# Patient Record
Sex: Female | Born: 1970
Health system: Southern US, Community
[De-identification: ages and names within clinical notes are randomized; demographics above are authoritative.]

## PROBLEM LIST (undated history)

## (undated) DIAGNOSIS — M549 Dorsalgia, unspecified: Secondary | ICD-10-CM

## (undated) DIAGNOSIS — K760 Fatty (change of) liver, not elsewhere classified: Secondary | ICD-10-CM

## (undated) DIAGNOSIS — I2510 Atherosclerotic heart disease of native coronary artery without angina pectoris: Secondary | ICD-10-CM

## (undated) DIAGNOSIS — I509 Heart failure, unspecified: Secondary | ICD-10-CM

## (undated) DIAGNOSIS — Z5189 Encounter for other specified aftercare: Secondary | ICD-10-CM

## (undated) DIAGNOSIS — K219 Gastro-esophageal reflux disease without esophagitis: Secondary | ICD-10-CM

## (undated) DIAGNOSIS — I10 Essential (primary) hypertension: Secondary | ICD-10-CM

## (undated) DIAGNOSIS — M25569 Pain in unspecified knee: Secondary | ICD-10-CM

## (undated) DIAGNOSIS — I2699 Other pulmonary embolism without acute cor pulmonale: Secondary | ICD-10-CM

## (undated) DIAGNOSIS — R519 Headache, unspecified: Secondary | ICD-10-CM

## (undated) DIAGNOSIS — G8929 Other chronic pain: Secondary | ICD-10-CM

## (undated) DIAGNOSIS — R51 Headache: Secondary | ICD-10-CM

## (undated) DIAGNOSIS — J449 Chronic obstructive pulmonary disease, unspecified: Secondary | ICD-10-CM

## (undated) DIAGNOSIS — G459 Transient cerebral ischemic attack, unspecified: Secondary | ICD-10-CM

## (undated) DIAGNOSIS — D649 Anemia, unspecified: Secondary | ICD-10-CM

## (undated) HISTORY — PX: TUBAL LIGATION: SHX77

## (undated) HISTORY — PX: ENDOMETRIAL ABLATION: SHX621

---

## 2016-04-14 ENCOUNTER — Encounter (HOSPITAL_BASED_OUTPATIENT_CLINIC_OR_DEPARTMENT_OTHER): Payer: Self-pay | Admitting: Emergency Medicine

## 2016-04-14 ENCOUNTER — Emergency Department (HOSPITAL_BASED_OUTPATIENT_CLINIC_OR_DEPARTMENT_OTHER): Payer: Medicare HMO

## 2016-04-14 ENCOUNTER — Emergency Department (HOSPITAL_BASED_OUTPATIENT_CLINIC_OR_DEPARTMENT_OTHER)
Admission: EM | Admit: 2016-04-14 | Discharge: 2016-04-15 | Disposition: A | Discharge status: Home / Self Care | Payer: Medicare HMO | Attending: Emergency Medicine | Admitting: Emergency Medicine

## 2016-04-14 DIAGNOSIS — Z7951 Long term (current) use of inhaled steroids: Secondary | ICD-10-CM | POA: Diagnosis not present

## 2016-04-14 DIAGNOSIS — I110 Hypertensive heart disease with heart failure: Secondary | ICD-10-CM | POA: Diagnosis not present

## 2016-04-14 DIAGNOSIS — Z8673 Personal history of transient ischemic attack (TIA), and cerebral infarction without residual deficits: Secondary | ICD-10-CM | POA: Diagnosis not present

## 2016-04-14 DIAGNOSIS — Z79899 Other long term (current) drug therapy: Secondary | ICD-10-CM | POA: Diagnosis not present

## 2016-04-14 DIAGNOSIS — M722 Plantar fascial fibromatosis: Secondary | ICD-10-CM

## 2016-04-14 DIAGNOSIS — J449 Chronic obstructive pulmonary disease, unspecified: Secondary | ICD-10-CM | POA: Diagnosis not present

## 2016-04-14 DIAGNOSIS — I509 Heart failure, unspecified: Secondary | ICD-10-CM | POA: Insufficient documentation

## 2016-04-14 DIAGNOSIS — Z792 Long term (current) use of antibiotics: Secondary | ICD-10-CM | POA: Diagnosis not present

## 2016-04-14 DIAGNOSIS — I10 Essential (primary) hypertension: Secondary | ICD-10-CM | POA: Diagnosis not present

## 2016-04-14 DIAGNOSIS — M79672 Pain in left foot: Secondary | ICD-10-CM | POA: Diagnosis present

## 2016-04-14 HISTORY — DX: Dorsalgia, unspecified: M54.9

## 2016-04-14 HISTORY — DX: Fatty (change of) liver, not elsewhere classified: K76.0

## 2016-04-14 HISTORY — DX: Atherosclerotic heart disease of native coronary artery without angina pectoris: I25.10

## 2016-04-14 HISTORY — DX: Headache: R51

## 2016-04-14 HISTORY — DX: Chronic obstructive pulmonary disease, unspecified: J44.9

## 2016-04-14 HISTORY — DX: Anemia, unspecified: D64.9

## 2016-04-14 HISTORY — DX: Other chronic pain: G89.29

## 2016-04-14 HISTORY — DX: Heart failure, unspecified: I50.9

## 2016-04-14 HISTORY — DX: Pain in unspecified knee: M25.569

## 2016-04-14 HISTORY — DX: Transient cerebral ischemic attack, unspecified: G45.9

## 2016-04-14 HISTORY — DX: Headache, unspecified: R51.9

## 2016-04-14 HISTORY — DX: Gastro-esophageal reflux disease without esophagitis: K21.9

## 2016-04-14 HISTORY — DX: Other pulmonary embolism without acute cor pulmonale: I26.99

## 2016-04-14 HISTORY — DX: Essential (primary) hypertension: I10

## 2016-04-14 HISTORY — DX: Encounter for other specified aftercare: Z51.89

## 2016-04-14 LAB — COMPREHENSIVE METABOLIC PANEL
ALK PHOS: 69 U/L (ref 38–126)
ALT: 11 U/L — AB (ref 14–54)
AST: 14 U/L — AB (ref 15–41)
Albumin: 3.6 g/dL (ref 3.5–5.0)
Anion gap: 6 (ref 5–15)
BILIRUBIN TOTAL: 0.6 mg/dL (ref 0.3–1.2)
BUN: 18 mg/dL (ref 6–20)
CALCIUM: 8.7 mg/dL — AB (ref 8.9–10.3)
CO2: 27 mmol/L (ref 22–32)
CREATININE: 1.12 mg/dL — AB (ref 0.44–1.00)
Chloride: 102 mmol/L (ref 101–111)
GFR calc Af Amer: >60 mL/min (ref >60)
GFR, EST NON AFRICAN AMERICAN: 58 mL/min — AB (ref >60)
GLUCOSE: 104 mg/dL — AB (ref 65–99)
POTASSIUM: 3.7 mmol/L (ref 3.5–5.1)
Sodium: 135 mmol/L (ref 135–145)
TOTAL PROTEIN: 6.9 g/dL (ref 6.5–8.1)

## 2016-04-14 LAB — CBC WITH DIFFERENTIAL/PLATELET
BASOS ABS: 0 10*3/uL (ref 0.0–0.1)
BASOS PCT: 0 %
EOS ABS: 0.6 10*3/uL (ref 0.0–0.7)
EOS PCT: 9 %
HCT: 37.1 % (ref 36.0–46.0)
Hemoglobin: 12.2 g/dL (ref 12.0–15.0)
Lymphocytes Relative: 21 %
Lymphs Abs: 1.4 10*3/uL (ref 0.7–4.0)
MCH: 31 pg (ref 26.0–34.0)
MCHC: 32.9 g/dL (ref 30.0–36.0)
MCV: 94.4 fL (ref 78.0–100.0)
MONO ABS: 0.6 10*3/uL (ref 0.1–1.0)
Monocytes Relative: 8 %
Neutro Abs: 4.2 10*3/uL (ref 1.7–7.7)
Neutrophils Relative %: 62 %
PLATELETS: 210 10*3/uL (ref 150–400)
RBC: 3.93 MIL/uL (ref 3.87–5.11)
RDW: 13.8 % (ref 11.5–15.5)
WBC: 6.9 10*3/uL (ref 4.0–10.5)

## 2016-04-14 LAB — PROTIME-INR
INR: 1.88
PROTHROMBIN TIME: 21.9 s — AB (ref 11.4–15.2)

## 2016-04-14 NOTE — ED Triage Notes (Signed)
Pt having left foot pain over one month.  Heel hurts but pt is unable to visualize her foot due to size.  Pt states her legs seem more swollen.

## 2016-04-14 NOTE — ED Notes (Addendum)
C/o L heel pain, ongoing worsening, TTP, heel skin dry flaky intact, no redness bruising or other markings, BLE morbidly obese equally w/o edema, seen previously at [facility] with xrays "negative", takes daily oxycodone and that is not helping, has not seen specialist, PCP is a Dr. Luana Shu. Pinpoints pain to "just L heel", (denies: injury, foot, ankle leg pain, numbness/ tingling or weakness), "worse at night". Family member at [facility].  Other family member also a pt in the ED at this time.

## 2016-04-14 NOTE — ED Provider Notes (Signed)
[place] DEPT [facility] Provider Note   CSN: [id number] Arrival date & time: 04/14/16  1847  By signing my name below, I, Irene Pap, attest that this documentation has been prepared under the direction and in the presence of Alyse Low, [place]. Electronically Signed: Irene Pap, ED Scribe. 04/14/16. 8:12 PM.  First Provider Contact:  None    History   Chief Complaint Chief Complaint  Patient presents with  . Foot Pain   The history is provided by the patient. No language interpreter was used.  HPI Comments: Heather York is a 45 y.o. female with a hx of CHF, COPD, CAD, HTN, PE, and TIA who presents to the Emergency Department complaining of gradually worsening left heel pain onset one month ago. Pt reports associated intermittent SOB and gradually worsening left leg swelling. She has been seen for the heel pain and her x-rays were negative. She states that she is unable to bear weight without intense pain. Pt is on fluid pills. She takes daily oxycodone for her chronic pain to no relief. She denies injury, fall, wound, numbness, or weakness.   Past Medical History:  Diagnosis Date  . Anemia   . Blood transfusion without reported diagnosis   . CHF (congestive heart failure) ([place])   . Chronic back pain   . Chronic knee pain   . COPD (chronic obstructive pulmonary disease) ([place])   . Coronary artery disease   . Fatty liver   . GERD (gastroesophageal reflux disease)   . Headache   . Hypertension   . PE (pulmonary embolism)   . TIA (transient ischemic attack)     There are no active problems to display for this patient.   Past Surgical History:  Procedure Laterality Date  . CESAREAN SECTION    . ENDOMETRIAL ABLATION    . TUBAL LIGATION      OB History    No data available       Home Medications    Prior to Admission medications   Medication Sig Start Date End Date Taking? Authorizing Provider  albuterol (PROVENTIL HFA;VENTOLIN HFA) 108 (90 Base) MCG/ACT  inhaler Inhale into the lungs every 6 (six) hours as needed for wheezing or shortness of breath.   Yes Historical Provider, MD  amiodarone (PACERONE) 200 MG tablet Take 200 mg by mouth daily.   Yes Historical Provider, MD  atenolol (TENORMIN) 25 MG tablet Take 12.5 mg by mouth daily.   Yes Historical Provider, MD  cyclobenzaprine (FLEXERIL) 10 MG tablet Take 10 mg by mouth 3 (three) times daily as needed for muscle spasms.   Yes Historical Provider, MD  doxycycline (VIBRA-TABS) 100 MG tablet Take 100 mg by mouth 2 (two) times daily. 03/29/16  Yes Historical Provider, MD  esomeprazole (NEXIUM) 40 MG capsule Take 40 mg by mouth daily at 12 noon.   Yes Historical Provider, MD  ferrous sulfate 325 (65 FE) MG EC tablet Take 325 mg by mouth 3 (three) times daily with meals.   Yes Historical Provider, MD  furosemide (LASIX) 40 MG tablet Take 40 mg by mouth.   Yes Historical Provider, MD  oxycodone (OXY-IR) 5 MG capsule Take 5 mg by mouth every 4 (four) hours as needed.   Yes Historical Provider, MD  oxyCODONE-acetaminophen (ROXICET) 5-325 MG/5ML solution Take by mouth every 4 (four) hours as needed for severe pain.   Yes Historical Provider, MD  promethazine (PHENERGAN) 25 MG tablet Take 25 mg by mouth every 6 (six) hours as needed for nausea or  vomiting.   Yes Historical Provider, MD  spironolactone (ALDACTONE) 50 MG tablet Take 50 mg by mouth daily.   Yes Historical Provider, MD  warfarin (COUMADIN) 5 MG tablet Take 5 mg by mouth daily.   Yes Historical Provider, MD    Family History No family history on file.  Social History Social History  Substance Use Topics  . Smoking status: Never Smoker  . Smokeless tobacco: Never Used  . Alcohol use Not on file     Allergies   Clindamycin/lincomycin; Rocephin [ceftriaxone sodium in dextrose]; and Vancomycin   Review of Systems Review of Systems  Musculoskeletal: Positive for arthralgias.  Skin: Negative for wound.  Neurological: Negative for  weakness and numbness.  All other systems reviewed and are negative.    Physical Exam Updated Vital Signs BP 112/68 (BP Location: Right Arm)   Pulse 76   Temp 98.4 F (36.9 C) (Oral)   Resp 22   Ht 5\' 6"  (1.676 m)   Wt (!) 371 lb (168.3 kg)   LMP 03/27/2016 (Approximate)   SpO2 98%   BMI 59.88 kg/m   Physical Exam  Constitutional: She is oriented to person, place, and time.  Morbidly obese  HENT:  Head: Normocephalic and atraumatic.  Mouth/Throat: Oropharynx is clear and moist.  Eyes: Conjunctivae and EOM are normal. Pupils are equal, round, and reactive to light.  Neck: Normal range of motion. Neck supple.  Cardiovascular: Normal rate and regular rhythm.   Pulmonary/Chest: Effort normal.  Abdominal: Soft. There is no tenderness.  Musculoskeletal: Normal range of motion. She exhibits edema.  Significant bilateral lower extremity edema  Neurological: She is alert and oriented to person, place, and time.  Skin: Skin is warm and dry.  Significant flaking to the skin on the plantar surface of the left foot; no drainage, erythema, or signs of infection  Psychiatric: She has a normal mood and affect. Her behavior is normal.  Nursing note and vitals reviewed.    ED Treatments / Results  DIAGNOSTIC STUDIES: Oxygen Saturation is 98% on RA, normal by my interpretation.    COORDINATION OF CARE: 8:09 PM-Discussed treatment plan which includes labs and x-ray with pt at bedside and pt agreed to plan.    Labs (all labs ordered are listed, but only abnormal results are displayed) Labs Reviewed  COMPREHENSIVE METABOLIC PANEL - Abnormal; Notable for the following:       Result Value   Glucose, Bld 104 (*)    Creatinine, Ser 1.12 (*)    Calcium 8.7 (*)    AST 14 (*)    ALT 11 (*)    GFR calc non Af Amer 58 (*)    All other components within normal limits  PROTIME-INR - Abnormal; Notable for the following:    Prothrombin Time 21.9 (*)    All other components within normal  limits  CBC WITH DIFFERENTIAL/PLATELET    EKG  EKG Interpretation None       Radiology Dg Foot Complete Left  Result Date: 04/14/2016 CLINICAL DATA:  Chronic left heel pain for 1 month. No known injury. Limited range of motion. EXAM: LEFT FOOT - COMPLETE 3+ VIEW COMPARISON:  None. FINDINGS: Osseous alignment is normal. Bone mineralization appears grossly normal throughout. No fracture line or displaced fracture fragment identified. No evidence of degenerative osteoarthritis. No erosions or other signs of an inflammatory arthritis. Adjacent soft tissues are unremarkable. Enthesophyte noted at the plantar margin of the posterior calcaneus, indicating a chronic tendinopathy. IMPRESSION: 1. Enthesophyte at  the plantar margin of the posterior calcaneus, indicating chronic tendinopathy, a possible source for the chronic heel pain. 2. No acute findings. Electronically Signed   By: Franki Cabot M.D.   On: 04/14/2016 20:44    Procedures Procedures (including critical care time)  Medications Ordered in ED Medications - No data to display   Initial Impression / Assessment and Plan / ED Course  I have reviewed the triage vital signs and the nursing notes.  Pertinent labs & imaging results that were available during my care of the patient were reviewed by me and considered in my medical decision making (see chart for details).  Clinical Course      Final Clinical Impressions(s) / ED Diagnoses   Final diagnoses:  Plantar fasciitis of left foot   Pt is on oxycodone.  Pt gets regular prescriptions every month.    New Prescriptions New Prescriptions   No medications on file     Fransico Meadow, PA-C 04/15/16 [place] Liu, MD 04/15/16 1054

## 2016-04-14 NOTE — ED Notes (Signed)
Pt to xray via stretcher, no changes.

## 2017-01-16 ENCOUNTER — Emergency Department (HOSPITAL_BASED_OUTPATIENT_CLINIC_OR_DEPARTMENT_OTHER)
Admission: EM | Admit: 2017-01-16 | Discharge: 2017-01-16 | Disposition: A | Discharge status: Home / Self Care | Payer: Medicare HMO | Attending: Emergency Medicine | Admitting: Emergency Medicine

## 2017-01-16 ENCOUNTER — Emergency Department (HOSPITAL_BASED_OUTPATIENT_CLINIC_OR_DEPARTMENT_OTHER): Payer: Medicare HMO

## 2017-01-16 ENCOUNTER — Encounter (HOSPITAL_BASED_OUTPATIENT_CLINIC_OR_DEPARTMENT_OTHER): Payer: Self-pay | Admitting: Emergency Medicine

## 2017-01-16 DIAGNOSIS — I110 Hypertensive heart disease with heart failure: Secondary | ICD-10-CM | POA: Insufficient documentation

## 2017-01-16 DIAGNOSIS — I509 Heart failure, unspecified: Secondary | ICD-10-CM | POA: Diagnosis not present

## 2017-01-16 DIAGNOSIS — Z7901 Long term (current) use of anticoagulants: Secondary | ICD-10-CM | POA: Insufficient documentation

## 2017-01-16 DIAGNOSIS — Z79899 Other long term (current) drug therapy: Secondary | ICD-10-CM | POA: Diagnosis not present

## 2017-01-16 DIAGNOSIS — Z87891 Personal history of nicotine dependence: Secondary | ICD-10-CM | POA: Diagnosis not present

## 2017-01-16 DIAGNOSIS — R1030 Lower abdominal pain, unspecified: Secondary | ICD-10-CM | POA: Diagnosis not present

## 2017-01-16 DIAGNOSIS — J449 Chronic obstructive pulmonary disease, unspecified: Secondary | ICD-10-CM | POA: Insufficient documentation

## 2017-01-16 DIAGNOSIS — I2510 Atherosclerotic heart disease of native coronary artery without angina pectoris: Secondary | ICD-10-CM | POA: Insufficient documentation

## 2017-01-16 DIAGNOSIS — R102 Pelvic and perineal pain: Secondary | ICD-10-CM | POA: Diagnosis present

## 2017-01-16 LAB — CBC WITH DIFFERENTIAL/PLATELET
BASOS ABS: 0 10*3/uL (ref 0.0–0.1)
Basophils Relative: 0 %
EOS PCT: 5 %
Eosinophils Absolute: 0.3 10*3/uL (ref 0.0–0.7)
HEMATOCRIT: 38.2 % (ref 36.0–46.0)
Hemoglobin: 12.7 g/dL (ref 12.0–15.0)
LYMPHS ABS: 1 10*3/uL (ref 0.7–4.0)
LYMPHS PCT: 18 %
MCH: 30 pg (ref 26.0–34.0)
MCHC: 33.2 g/dL (ref 30.0–36.0)
MCV: 90.1 fL (ref 78.0–100.0)
MONOS PCT: 8 %
Monocytes Absolute: 0.5 10*3/uL (ref 0.1–1.0)
NEUTROS ABS: 3.9 10*3/uL (ref 1.7–7.7)
Neutrophils Relative %: 69 %
Platelets: 218 10*3/uL (ref 150–400)
RBC: 4.24 MIL/uL (ref 3.87–5.11)
RDW: 13.6 % (ref 11.5–15.5)
WBC: 5.7 10*3/uL (ref 4.0–10.5)

## 2017-01-16 LAB — COMPREHENSIVE METABOLIC PANEL
ALT: 22 U/L (ref 14–54)
AST: 19 U/L (ref 15–41)
Albumin: 3.4 g/dL — ABNORMAL LOW (ref 3.5–5.0)
Alkaline Phosphatase: 95 U/L (ref 38–126)
Anion gap: 5 (ref 5–15)
BILIRUBIN TOTAL: 0.5 mg/dL (ref 0.3–1.2)
BUN: 9 mg/dL (ref 6–20)
CO2: 28 mmol/L (ref 22–32)
Calcium: 8.4 mg/dL — ABNORMAL LOW (ref 8.9–10.3)
Chloride: 105 mmol/L (ref 101–111)
Creatinine, Ser: 1.05 mg/dL — ABNORMAL HIGH (ref 0.44–1.00)
Glucose, Bld: 109 mg/dL — ABNORMAL HIGH (ref 65–99)
POTASSIUM: 3.5 mmol/L (ref 3.5–5.1)
Sodium: 138 mmol/L (ref 135–145)
TOTAL PROTEIN: 6.6 g/dL (ref 6.5–8.1)

## 2017-01-16 LAB — LIPASE, BLOOD: LIPASE: 23 U/L (ref 11–51)

## 2017-01-16 LAB — URINALYSIS, ROUTINE W REFLEX MICROSCOPIC
BILIRUBIN URINE: NEGATIVE
Glucose, UA: NEGATIVE mg/dL
HGB URINE DIPSTICK: NEGATIVE
Ketones, ur: NEGATIVE mg/dL
NITRITE: NEGATIVE
PROTEIN: NEGATIVE mg/dL
SPECIFIC GRAVITY, URINE: 1.007 (ref 1.005–1.030)
pH: 5.5 (ref 5.0–8.0)

## 2017-01-16 LAB — PREGNANCY, URINE: PREG TEST UR: NEGATIVE

## 2017-01-16 LAB — URINALYSIS, MICROSCOPIC (REFLEX)

## 2017-01-16 LAB — PROTIME-INR
INR: 1.79
PROTHROMBIN TIME: 21 s — AB (ref 11.4–15.2)

## 2017-01-16 MED ORDER — KETOROLAC TROMETHAMINE 30 MG/ML IJ SOLN
30.0000 mg | Freq: Once | INTRAMUSCULAR | Status: AC
  Administered 2017-01-16: 30 mg via INTRAVENOUS
  Filled 2017-01-16: qty 1

## 2017-01-16 MED ORDER — SODIUM CHLORIDE 0.9 % IV BOLUS (SEPSIS)
1000.0000 mL | Freq: Once | INTRAVENOUS | Status: AC
  Administered 2017-01-16: 1000 mL via INTRAVENOUS

## 2017-01-16 MED ORDER — DICYCLOMINE HCL 20 MG PO TABS: 20.0000 mg | ORAL_TABLET | Freq: Two times a day (BID) | ORAL | 0 refills | Status: AC

## 2017-01-16 MED ORDER — IOPAMIDOL (ISOVUE-300) INJECTION 61%
100.0000 mL | Freq: Once | INTRAVENOUS | Status: AC | PRN
  Administered 2017-01-16: 100 mL via INTRAVENOUS

## 2017-01-16 MED ORDER — MORPHINE SULFATE (PF) 4 MG/ML IV SOLN: 4.0000 mg | Freq: Once | INTRAVENOUS | Status: DC

## 2017-01-16 MED FILL — DICYCLOMINE 20 MG TABLET: 20 | 10 days supply | Qty: 20 | Fill #0

## 2017-01-16 NOTE — ED Triage Notes (Signed)
Patient states that she has some nausea yesterday and started to have some abdominal pain and points to her pelvic region. Patient reports that she "maybe Im pregnant" - patient states that she she has had pain to her stomach off and on, and did not have a period last month. Patient reports that she feels like the pain "feels like something is moving around in my stomach"

## 2017-01-16 NOTE — ED Provider Notes (Signed)
Blood pressure 129/79, pulse 100, temperature 98.3 F (36.8 C), temperature source Oral, resp. rate 18, height 5\' 6"  (1.676 m), weight (!) 388 lb (176 kg), SpO2 98 %.  Assuming care from Dr. Darl Householder.  In short, Heather York is a 45 y.o. female with a chief complaint of Pelvic Pain .  Refer to the original H&P for additional details.  The current plan of care is to follow labs and CT with reassessment.  04:10 PM CT scan with no acute findings to explain the patient's abdominal pain. Labs reviewed and unremarkable. I reevaluated the patient is feeling well. Discussed primary care physician follow-up and medication at home for symptom relief. Patient is comfortable with the plan at discharge.  At this time, I do not feel there is any life-threatening condition present. I have reviewed and discussed all results (EKG, imaging, lab, urine as appropriate), exam findings with patient. I have reviewed nursing notes and appropriate previous records.  I feel the patient is safe to be discharged home without further emergent workup. Discussed usual and customary return precautions. Patient and family (if present) verbalize understanding and are comfortable with this plan.  Patient will follow-up with their primary care provider. If they do not have a primary care provider, information for follow-up has been provided to them. All questions have been answered.  Nanda Quinton, MD    Margette Fast, MD 01/16/17 [phone]

## 2017-01-16 NOTE — ED Notes (Signed)
Dr. Darl Householder in to do pelvic exam with RN at bedside unable to get swabs due to pt. Not cooperative with exam.  Bi Manual done on pt.

## 2017-01-16 NOTE — Discharge Instructions (Signed)

## 2017-01-16 NOTE — ED Notes (Signed)
Pt. Is in CT scan at present time

## 2017-01-16 NOTE — ED Provider Notes (Signed)
[place] DEPT [facility] Provider Note   CSN: [id number] Arrival date & time: 01/16/17  1350     History   Chief Complaint Chief Complaint  Patient presents with  . Pelvic Pain    HPI Heather York is a 46 y.o. female hx of COPD, TIA, PE on Coumadin here presenting with lower abdominal pain, pelvic pain. Patient states that she's been having diffuse lower abdominal pain since yesterday. It was left-sided then went to her right side. States that she felt something moving and was concerned that she may be pregnant. She has history of irregular periods but did not have her menstrual period last month. Patient states that she was nauseated but denies any vomiting. Denies any constipation or diarrhea.   The history is provided by the patient.    Past Medical History:  Diagnosis Date  . Anemia   . Blood transfusion without reported diagnosis   . CHF (congestive heart failure) ([place])   . Chronic back pain   . Chronic knee pain   . COPD (chronic obstructive pulmonary disease) ([place])   . Coronary artery disease   . Fatty liver   . GERD (gastroesophageal reflux disease)   . Headache   . Hypertension   . PE (pulmonary embolism)   . TIA (transient ischemic attack)     There are no active problems to display for this patient.   Past Surgical History:  Procedure Laterality Date  . CESAREAN SECTION    . ENDOMETRIAL ABLATION    . TUBAL LIGATION      OB History    No data available       Home Medications    Prior to Admission medications   Medication Sig Start Date End Date Taking? Authorizing Provider  albuterol (PROVENTIL HFA;VENTOLIN HFA) 108 (90 Base) MCG/ACT inhaler Inhale into the lungs every 6 (six) hours as needed for wheezing or shortness of breath.    [provider]  amiodarone (PACERONE) 200 MG tablet Take 200 mg by mouth daily.    [provider]  atenolol (TENORMIN) 25 MG tablet Take 12.5 mg by mouth daily.    [provider]    cyclobenzaprine (FLEXERIL) 10 MG tablet Take 10 mg by mouth 3 (three) times daily as needed for muscle spasms.    [provider]  doxycycline (VIBRA-TABS) 100 MG tablet Take 100 mg by mouth 2 (two) times daily. 03/29/16   [provider]  esomeprazole (NEXIUM) 40 MG capsule Take 40 mg by mouth daily at 12 noon.    [provider]  ferrous sulfate 325 (65 FE) MG EC tablet Take 325 mg by mouth 3 (three) times daily with meals.    [provider]  furosemide (LASIX) 40 MG tablet Take 40 mg by mouth.    [provider]  oxycodone (OXY-IR) 5 MG capsule Take 5 mg by mouth every 4 (four) hours as needed.    [provider]  oxyCODONE-acetaminophen (ROXICET) 5-325 MG/5ML solution Take by mouth every 4 (four) hours as needed for severe pain.    [provider]  promethazine (PHENERGAN) 25 MG tablet Take 25 mg by mouth every 6 (six) hours as needed for nausea or vomiting.    [provider]  spironolactone (ALDACTONE) 50 MG tablet Take 50 mg by mouth daily.    [provider]  warfarin (COUMADIN) 5 MG tablet Take 5 mg by mouth daily.    [provider]    Family History History reviewed. No  pertinent family history.  Social History Social History  Substance Use Topics  . Smoking status: Former Research scientist (life sciences)  . Smokeless tobacco: Never Used  . Alcohol use No     Allergies   Clindamycin/lincomycin; Rocephin [ceftriaxone sodium in dextrose]; and Vancomycin   Review of Systems Review of Systems  Genitourinary: Positive for pelvic pain.  All other systems reviewed and are negative.    Physical Exam Updated Vital Signs BP 129/79 (BP Location: Right Arm)   Pulse 100   Temp 98.3 F (36.8 C) (Oral)   Resp 18   Ht 5\' 6"  (1.676 m)   Wt (!) 388 lb (176 kg)   LMP  (LMP Unknown)   SpO2 98%   BMI 62.62 kg/m   Physical Exam  Constitutional: She is oriented to person, place, and time.  Uncomfortable, obese    HENT:  Head: Normocephalic.  Mouth/Throat: Oropharynx is clear and moist.  Eyes: EOM are normal. Pupils are equal, round, and reactive to light.  Neck: Normal range of motion. Neck supple.  Cardiovascular: Normal rate, regular rhythm and normal heart sounds.   Pulmonary/Chest: Effort normal and breath sounds normal. No respiratory distress. She has no wheezes.  Abdominal: Soft. Bowel sounds are normal.  Obese, mild diffuse lower abdominal tenderness   Genitourinary:  Genitourinary Comments: Difficult exam, no obvious yeast. Difficulty reaching the vagina due to body habitus. Has severe pain with exam. Unable to get swabs but bimanual exam showed no CMT, no obvious uterine or adnexal tenderness   Neurological: She is alert and oriented to person, place, and time.  Skin: Skin is warm.  Psychiatric: She has a normal mood and affect.  Nursing note and vitals reviewed.    ED Treatments / Results  Labs (all labs ordered are listed, but only abnormal results are displayed) Labs Reviewed  URINALYSIS, ROUTINE W REFLEX MICROSCOPIC - Abnormal; Notable for the following:       Result Value   APPearance CLOUDY (*)    Leukocytes, UA TRACE (*)    All other components within normal limits  COMPREHENSIVE METABOLIC PANEL - Abnormal; Notable for the following:    Glucose, Bld 109 (*)    Creatinine, Ser 1.05 (*)    Calcium 8.4 (*)    Albumin 3.4 (*)    All other components within normal limits  URINALYSIS, MICROSCOPIC (REFLEX) - Abnormal; Notable for the following:    Bacteria, UA FEW (*)    Squamous Epithelial / LPF 6-30 (*)    All other components within normal limits  PREGNANCY, URINE  CBC WITH DIFFERENTIAL/PLATELET  LIPASE, BLOOD  PROTIME-INR  GC/CHLAMYDIA PROBE AMP ([place]) NOT AT [facility]    EKG  EKG Interpretation None       Radiology No results found.  Procedures Procedures (including critical care time)  Medications Ordered in ED Medications  ketorolac (TORADOL) 30  MG/ML injection 30 mg (not administered)  sodium chloride 0.9 % bolus 1,000 mL (1,000 mLs Intravenous New Bag/Given 01/16/17 1451)  iopamidol (ISOVUE-300) 61 % injection 100 mL (100 mLs Intravenous Contrast Given 01/16/17 1531)     Initial Impression / Assessment and Plan / ED Course  I have reviewed the triage vital signs and the nursing notes.  Pertinent labs & imaging results that were available during my care of the patient were reviewed by me and considered in my medical decision making (see chart for details).     Heather York is a 46 y.o. female here with lower abdominal pain since yesterday.  She missed her period a month ago but has irregular periods at baseline but UCG neg. Difficult performing pelvic exam due to body habitus so unable to get swab. Will check labs, UA, CT ab/pel.   3:34 PM Labs unremarkable. UA unremarkable. CT ab/pel pending. Signed out to Dr. Laverta Baltimore. Anticipate dc home if CT negative.   Final Clinical Impressions(s) / ED Diagnoses   Final diagnoses:  None    New Prescriptions New Prescriptions   No medications on file     Drenda Freeze, MD 01/16/17 1534

## 2017-01-18 LAB — URINE CULTURE

## 2017-07-14 ENCOUNTER — Emergency Department (HOSPITAL_BASED_OUTPATIENT_CLINIC_OR_DEPARTMENT_OTHER): Payer: Medicare HMO

## 2017-07-14 ENCOUNTER — Encounter (HOSPITAL_BASED_OUTPATIENT_CLINIC_OR_DEPARTMENT_OTHER): Payer: Self-pay | Admitting: *Deleted

## 2017-07-14 ENCOUNTER — Emergency Department (HOSPITAL_BASED_OUTPATIENT_CLINIC_OR_DEPARTMENT_OTHER)
Admission: EM | Admit: 2017-07-14 | Discharge: 2017-07-14 | Disposition: A | Discharge status: Home / Self Care | Payer: Medicare HMO | Attending: Physician Assistant | Admitting: Physician Assistant

## 2017-07-14 DIAGNOSIS — Z8673 Personal history of transient ischemic attack (TIA), and cerebral infarction without residual deficits: Secondary | ICD-10-CM | POA: Diagnosis not present

## 2017-07-14 DIAGNOSIS — G8929 Other chronic pain: Secondary | ICD-10-CM | POA: Insufficient documentation

## 2017-07-14 DIAGNOSIS — B9689 Other specified bacterial agents as the cause of diseases classified elsewhere: Secondary | ICD-10-CM | POA: Diagnosis not present

## 2017-07-14 DIAGNOSIS — J069 Acute upper respiratory infection, unspecified: Secondary | ICD-10-CM | POA: Diagnosis not present

## 2017-07-14 DIAGNOSIS — Z79899 Other long term (current) drug therapy: Secondary | ICD-10-CM | POA: Diagnosis not present

## 2017-07-14 DIAGNOSIS — D649 Anemia, unspecified: Secondary | ICD-10-CM | POA: Insufficient documentation

## 2017-07-14 DIAGNOSIS — I110 Hypertensive heart disease with heart failure: Secondary | ICD-10-CM | POA: Diagnosis not present

## 2017-07-14 DIAGNOSIS — I2510 Atherosclerotic heart disease of native coronary artery without angina pectoris: Secondary | ICD-10-CM | POA: Diagnosis not present

## 2017-07-14 DIAGNOSIS — I509 Heart failure, unspecified: Secondary | ICD-10-CM | POA: Insufficient documentation

## 2017-07-14 DIAGNOSIS — Z87891 Personal history of nicotine dependence: Secondary | ICD-10-CM | POA: Insufficient documentation

## 2017-07-14 DIAGNOSIS — J449 Chronic obstructive pulmonary disease, unspecified: Secondary | ICD-10-CM | POA: Insufficient documentation

## 2017-07-14 DIAGNOSIS — J029 Acute pharyngitis, unspecified: Secondary | ICD-10-CM | POA: Diagnosis present

## 2017-07-14 DIAGNOSIS — B9789 Other viral agents as the cause of diseases classified elsewhere: Secondary | ICD-10-CM

## 2017-07-14 LAB — RAPID STREP SCREEN (MED CTR MEBANE ONLY): STREPTOCOCCUS, GROUP A SCREEN (DIRECT): NEGATIVE

## 2017-07-14 MED ORDER — IPRATROPIUM-ALBUTEROL 0.5-2.5 (3) MG/3ML IN SOLN
3.0000 mL | Freq: Once | RESPIRATORY_TRACT | Status: AC
  Administered 2017-07-14: 3 mL via RESPIRATORY_TRACT
  Filled 2017-07-14: qty 3

## 2017-07-14 MED ORDER — PREDNISONE 20 MG PO TABS: ORAL_TABLET | ORAL | 0 refills | Status: AC

## 2017-07-14 MED ORDER — GI COCKTAIL ~~LOC~~
30.0000 mL | Freq: Once | ORAL | Status: AC
Start: 2017-07-14 — End: 2017-07-14
  Administered 2017-07-14: 30 mL via ORAL
  Filled 2017-07-14: qty 30

## 2017-07-14 MED ORDER — ALBUTEROL SULFATE HFA 108 (90 BASE) MCG/ACT IN AERS
2.0000 | INHALATION_SPRAY | Freq: Once | RESPIRATORY_TRACT | Status: AC
  Administered 2017-07-14: 2 via RESPIRATORY_TRACT
  Filled 2017-07-14: qty 6.7

## 2017-07-14 MED ORDER — PREDNISONE 50 MG PO TABS
60.0000 mg | ORAL_TABLET | Freq: Once | ORAL | Status: AC
  Administered 2017-07-14: 60 mg via ORAL
  Filled 2017-07-14: qty 1

## 2017-07-14 MED ORDER — AEROCHAMBER PLUS FLO-VU MEDIUM MISC
1.0000 | Freq: Once | Status: AC
  Administered 2017-07-14: 1
  Filled 2017-07-14: qty 1

## 2017-07-14 NOTE — ED Notes (Signed)
ED Provider at bedside. 

## 2017-07-14 NOTE — ED Notes (Signed)
Patient transported to X-ray 

## 2017-07-14 NOTE — ED Provider Notes (Signed)
[facility] EMERGENCY DEPARTMENT Provider Note   CSN: [id number] Arrival date & time: 07/14/17  1139     History   Chief Complaint Chief Complaint  Patient presents with  . Sore Throat    HPI Heather York is a 46 y.o. female.  HPI   Patient is a 46 year old female with history of COPD, CHF, obesity, Chronic pain.  She is presenting today with sore throat.  Patient reports that for the last week and a half she has had congestion, cough and wheezing.  Patient was seen at a different urgent care and told that she had fluid on her lungs.  She doubled her Lasix and has been feeling better.  However she still has sore throat.  She says she has pain on swallowing.  Past Medical History:  Diagnosis Date  . Anemia   . Blood transfusion without reported diagnosis    age 86 yrs  . CHF (congestive heart failure) ([place])   . Chronic back pain   . Chronic knee pain   . COPD (chronic obstructive pulmonary disease) ([place])   . Coronary artery disease   . Fatty liver   . GERD (gastroesophageal reflux disease)   . Headache   . Hypertension   . PE (pulmonary embolism)   . TIA (transient ischemic attack)     There are no active problems to display for this patient.   Past Surgical History:  Procedure Laterality Date  . CESAREAN SECTION    . ENDOMETRIAL ABLATION    . TUBAL LIGATION      OB History    No data available       Home Medications    Prior to Admission medications   Medication Sig Start Date End Date Taking? Authorizing Provider  albuterol (PROVENTIL HFA;VENTOLIN HFA) 108 (90 Base) MCG/ACT inhaler Inhale into the lungs every 6 (six) hours as needed for wheezing or shortness of breath.   Yes [provider]  amiodarone (PACERONE) 200 MG tablet Take 200 mg by mouth daily.   Yes [provider]  atenolol (TENORMIN) 25 MG tablet Take 12.5 mg by mouth daily.   Yes [provider]  cyclobenzaprine (FLEXERIL) 10 MG tablet Take 10 mg by  mouth 3 (three) times daily as needed for muscle spasms.   Yes [provider]  furosemide (LASIX) 40 MG tablet Take 40 mg by mouth.   Yes [provider]  OMEPRAZOLE PO Take by mouth.   Yes [provider]  oxycodone (OXY-IR) 5 MG capsule Take 5 mg by mouth every 4 (four) hours as needed.   Yes [provider]  oxyCODONE-acetaminophen (ROXICET) 5-325 MG/5ML solution Take by mouth every 4 (four) hours as needed for severe pain.   Yes [provider]  spironolactone (ALDACTONE) 50 MG tablet Take 50 mg by mouth daily.   Yes [provider]  warfarin (COUMADIN) 5 MG tablet Take 5 mg by mouth daily.   Yes [provider]  dicyclomine (BENTYL) 20 MG tablet Take 1 tablet (20 mg total) by mouth 2 (two) times daily. 01/16/17   Long, Wonda Olds, MD  doxycycline (VIBRA-TABS) 100 MG tablet Take 100 mg by mouth 2 (two) times daily. 03/29/16   [provider]  esomeprazole (NEXIUM) 40 MG capsule Take 40 mg by mouth daily at 12 noon.    [provider]  ferrous sulfate 325 (65 FE) MG EC tablet Take 325 mg by mouth 3 (three) times daily with meals.  [provider]  promethazine (PHENERGAN) 25 MG tablet Take 25 mg by mouth every 6 (six) hours as needed for nausea or vomiting.    [provider]    Family History No family history on file.  Social History Social History  Substance Use Topics  . Smoking status: Former Research scientist (life sciences)  . Smokeless tobacco: Never Used  . Alcohol use No     Allergies   Clindamycin/lincomycin; Rocephin [ceftriaxone sodium in dextrose]; and Vancomycin   Review of Systems Review of Systems  Constitutional: Negative for activity change and fever.  HENT: Positive for congestion and sore throat.   Respiratory: Positive for cough and wheezing. Negative for chest tightness and shortness of breath.   Cardiovascular: Negative for chest pain.  Gastrointestinal: Negative for abdominal pain.    All other systems reviewed and are negative.    Physical Exam Updated Vital Signs BP 116/85 (BP Location: Left Arm)   Pulse 84   Temp 98.3 F (36.8 C) (Oral)   Resp 20   Ht 5\' 6"  (1.676 m)   Wt (!) 173.9 kg (383 lb 6 oz)   SpO2 99%   BMI 61.88 kg/m   Physical Exam  Constitutional: She is oriented to person, place, and time. She appears well-developed and well-nourished.  HENT:  Head: Normocephalic and atraumatic.  Right Ear: External ear normal.  Left Ear: External ear normal.  Mild erythema to posterior pharynx.  No exudate.  Eyes: Right eye exhibits no discharge.  Cardiovascular: Normal rate, regular rhythm and normal heart sounds.   No murmur heard. Pulmonary/Chest: Effort normal. She has wheezes. She has no rales.  Mild scattered wheezing.  Abdominal: Soft. She exhibits no distension. There is no tenderness.  Neurological: She is oriented to person, place, and time.  Skin: Skin is warm and dry. She is not diaphoretic.  Psychiatric: She has a normal mood and affect.  Nursing note and vitals reviewed.    ED Treatments / Results  Labs (all labs ordered are listed, but only abnormal results are displayed) Labs Reviewed  RAPID STREP SCREEN (NOT AT [facility])    EKG  EKG Interpretation None       Radiology No results found.  Procedures Procedures (including critical care time)  Medications Ordered in ED Medications  predniSONE (DELTASONE) tablet 60 mg (not administered)  ipratropium-albuterol (DUONEB) 0.5-2.5 (3) MG/3ML nebulizer solution 3 mL (not administered)  gi cocktail (Maalox,Lidocaine,Donnatal) (not administered)     Initial Impression / Assessment and Plan / ED Course  I have reviewed the triage vital signs and the nursing notes.  Pertinent labs & imaging results that were available during my care of the patient were reviewed by me and considered in my medical decision making (see chart for details).     Patient is a 46 year old female with  history of COPD, CHF, obesity, Chronic pain.  She is presenting today with sore throat.  Patient reports that for the last week and a half she has had congestion, cough and wheezing.  Patient was seen at a different urgent care and told that she had fluid on her lungs.  She doubled her Lasix and has been feeling better.  However she still has sore throat.  She says she has pain on swallowing.  12:14 PM Will get chest x-ray.  I think the wheezing is likely due to COPD exacerbation.  We will start her on steroids.  This was also help with inflammation in her throat.  Her constilation  of symptoms is  suggestive of a viral illness that has caused a COPD exacerbation.  1:35 PM Patient sounds much better after 1 DuoNeb.  We will give her albuterol inhaler for home.  We will give her steroid taper.  Normal vital signs and appears very well.  We will have her follow-up with primary care physician.  Final Clinical Impressions(s) / ED Diagnoses   Final diagnoses:  None    New Prescriptions New Prescriptions   No medications on file     Macarthur Critchley, MD 07/14/17 1336

## 2017-07-14 NOTE — Discharge Instructions (Signed)
There is will help decrease inflammation in her lungs and your throat.  Please follow-up with your primary care physician.

## 2017-07-14 NOTE — ED Notes (Signed)
Pt discharged to home with family. NAD.  

## 2017-07-14 NOTE — ED Triage Notes (Signed)
Sore Throat for one week.  No fever.

## 2017-07-16 LAB — CULTURE, GROUP A STREP (THRC)

## 2017-09-12 ENCOUNTER — Encounter (HOSPITAL_BASED_OUTPATIENT_CLINIC_OR_DEPARTMENT_OTHER): Payer: Self-pay | Admitting: Emergency Medicine

## 2017-09-12 ENCOUNTER — Emergency Department (HOSPITAL_BASED_OUTPATIENT_CLINIC_OR_DEPARTMENT_OTHER): Payer: Medicare HMO

## 2017-09-12 ENCOUNTER — Other Ambulatory Visit: Payer: Self-pay

## 2017-09-12 ENCOUNTER — Emergency Department (HOSPITAL_BASED_OUTPATIENT_CLINIC_OR_DEPARTMENT_OTHER)
Admission: EM | Admit: 2017-09-12 | Discharge: 2017-09-12 | Disposition: A | Discharge status: Home / Self Care | Payer: Medicare HMO | Attending: Emergency Medicine | Admitting: Emergency Medicine

## 2017-09-12 DIAGNOSIS — J441 Chronic obstructive pulmonary disease with (acute) exacerbation: Secondary | ICD-10-CM | POA: Diagnosis not present

## 2017-09-12 DIAGNOSIS — W06XXXA Fall from bed, initial encounter: Secondary | ICD-10-CM | POA: Diagnosis not present

## 2017-09-12 DIAGNOSIS — I2510 Atherosclerotic heart disease of native coronary artery without angina pectoris: Secondary | ICD-10-CM | POA: Diagnosis not present

## 2017-09-12 DIAGNOSIS — Y999 Unspecified external cause status: Secondary | ICD-10-CM | POA: Insufficient documentation

## 2017-09-12 DIAGNOSIS — Z79899 Other long term (current) drug therapy: Secondary | ICD-10-CM | POA: Diagnosis not present

## 2017-09-12 DIAGNOSIS — Z7901 Long term (current) use of anticoagulants: Secondary | ICD-10-CM | POA: Insufficient documentation

## 2017-09-12 DIAGNOSIS — Z87891 Personal history of nicotine dependence: Secondary | ICD-10-CM | POA: Insufficient documentation

## 2017-09-12 DIAGNOSIS — Y929 Unspecified place or not applicable: Secondary | ICD-10-CM | POA: Insufficient documentation

## 2017-09-12 DIAGNOSIS — I110 Hypertensive heart disease with heart failure: Secondary | ICD-10-CM | POA: Insufficient documentation

## 2017-09-12 DIAGNOSIS — Y939 Activity, unspecified: Secondary | ICD-10-CM | POA: Insufficient documentation

## 2017-09-12 DIAGNOSIS — S301XXA Contusion of abdominal wall, initial encounter: Secondary | ICD-10-CM

## 2017-09-12 DIAGNOSIS — I509 Heart failure, unspecified: Secondary | ICD-10-CM | POA: Insufficient documentation

## 2017-09-12 DIAGNOSIS — S299XXA Unspecified injury of thorax, initial encounter: Secondary | ICD-10-CM | POA: Diagnosis present

## 2017-09-12 LAB — PROTIME-INR
INR: 2.12
Prothrombin Time: 23.6 seconds — ABNORMAL HIGH (ref 11.4–15.2)

## 2017-09-12 LAB — COMPREHENSIVE METABOLIC PANEL
ALT: 19 U/L (ref 14–54)
ANION GAP: 8 (ref 5–15)
AST: 17 U/L (ref 15–41)
Albumin: 4 g/dL (ref 3.5–5.0)
Alkaline Phosphatase: 92 U/L (ref 38–126)
BILIRUBIN TOTAL: 0.6 mg/dL (ref 0.3–1.2)
BUN: 18 mg/dL (ref 6–20)
CHLORIDE: 102 mmol/L (ref 101–111)
CO2: 26 mmol/L (ref 22–32)
Calcium: 9.3 mg/dL (ref 8.9–10.3)
Creatinine, Ser: 1.26 mg/dL — ABNORMAL HIGH (ref 0.44–1.00)
GFR calc Af Amer: 58 mL/min — ABNORMAL LOW (ref >60)
GFR, EST NON AFRICAN AMERICAN: 50 mL/min — AB (ref >60)
Glucose, Bld: 118 mg/dL — ABNORMAL HIGH (ref 65–99)
POTASSIUM: 4 mmol/L (ref 3.5–5.1)
Sodium: 136 mmol/L (ref 135–145)
TOTAL PROTEIN: 7.7 g/dL (ref 6.5–8.1)

## 2017-09-12 LAB — CBC
HEMATOCRIT: 40.9 % (ref 36.0–46.0)
HEMOGLOBIN: 12.8 g/dL (ref 12.0–15.0)
MCH: 28.1 pg (ref 26.0–34.0)
MCHC: 31.3 g/dL (ref 30.0–36.0)
MCV: 89.9 fL (ref 78.0–100.0)
Platelets: 275 10*3/uL (ref 150–400)
RBC: 4.55 MIL/uL (ref 3.87–5.11)
RDW: 16.2 % — ABNORMAL HIGH (ref 11.5–15.5)
WBC: 8.6 10*3/uL (ref 4.0–10.5)

## 2017-09-12 MED ORDER — HYDROCODONE-ACETAMINOPHEN 5-325 MG PO TABS
2.0000 | ORAL_TABLET | Freq: Once | ORAL | Status: AC
  Administered 2017-09-12: 2 via ORAL
  Filled 2017-09-12: qty 2

## 2017-09-12 MED ORDER — IPRATROPIUM-ALBUTEROL 0.5-2.5 (3) MG/3ML IN SOLN
3.0000 mL | Freq: Once | RESPIRATORY_TRACT | Status: AC
  Administered 2017-09-12: 3 mL via RESPIRATORY_TRACT
  Filled 2017-09-12: qty 3

## 2017-09-12 MED ORDER — DEXAMETHASONE SODIUM PHOSPHATE 10 MG/ML IJ SOLN
10.0000 mg | Freq: Once | INTRAMUSCULAR | Status: AC
  Administered 2017-09-12: 10 mg via INTRAVENOUS
  Filled 2017-09-12: qty 1

## 2017-09-12 MED ORDER — IOPAMIDOL (ISOVUE-300) INJECTION 61%
100.0000 mL | Freq: Once | INTRAVENOUS | Status: AC | PRN
  Administered 2017-09-12: 100 mL via INTRAVENOUS

## 2017-09-12 MED ORDER — ALBUTEROL SULFATE HFA 108 (90 BASE) MCG/ACT IN AERS: 2.0000 | INHALATION_SPRAY | RESPIRATORY_TRACT | 0 refills | Status: AC | PRN

## 2017-09-12 NOTE — ED Notes (Signed)
Patient transported to CT 

## 2017-09-12 NOTE — ED Provider Notes (Signed)
[facility] EMERGENCY DEPARTMENT Provider Note   CSN: [id number] Arrival date & time: 09/12/17  1735     History   Chief Complaint Chief Complaint  Patient presents with  . Fall    HPI Heather York is a 47 y.o. female.  47 year old female with past medical history including PE on Coumadin, CHF, COPD, CAD who presents with right side pain after a fall.  At 8 AM yesterday, she fell out of bed and struck her right arm and right side on the dresser and floor.  She states she did not hit her head or lose consciousness.  No vomiting.  She reports severe, constant pain on her right side worse when she moves around.  No other injuries.  Review of systems notable for chronic cough that has been slightly worse recently with some wheezing.  No fevers.  She has been using albuterol recently because of this.   The history is provided by the patient.    Past Medical History:  Diagnosis Date  . Anemia   . Blood transfusion without reported diagnosis    age 72 yrs  . CHF (congestive heart failure) ([place])   . Chronic back pain   . Chronic knee pain   . COPD (chronic obstructive pulmonary disease) ([place])   . Coronary artery disease   . Fatty liver   . GERD (gastroesophageal reflux disease)   . Headache   . Hypertension   . PE (pulmonary embolism)   . TIA (transient ischemic attack)     There are no active problems to display for this patient.   Past Surgical History:  Procedure Laterality Date  . CESAREAN SECTION    . ENDOMETRIAL ABLATION    . TUBAL LIGATION      OB History    No data available       Home Medications    Prior to Admission medications   Medication Sig Start Date End Date Taking? Authorizing Provider  albuterol (PROVENTIL HFA;VENTOLIN HFA) 108 (90 Base) MCG/ACT inhaler Inhale 2 puffs into the lungs every 4 (four) hours as needed for wheezing or shortness of breath. 09/12/17   Paysen Goza, Wenda Overland, MD  amiodarone (PACERONE) 200 MG tablet Take 200 mg by  mouth daily.    [provider]  atenolol (TENORMIN) 25 MG tablet Take 12.5 mg by mouth daily.    [provider]  cyclobenzaprine (FLEXERIL) 10 MG tablet Take 10 mg by mouth 3 (three) times daily as needed for muscle spasms.    [provider]  dicyclomine (BENTYL) 20 MG tablet Take 1 tablet (20 mg total) by mouth 2 (two) times daily. 01/16/17   Long, Wonda Olds, MD  doxycycline (VIBRA-TABS) 100 MG tablet Take 100 mg by mouth 2 (two) times daily. 03/29/16   [provider]  esomeprazole (NEXIUM) 40 MG capsule Take 40 mg by mouth daily at 12 noon.    [provider]  ferrous sulfate 325 (65 FE) MG EC tablet Take 325 mg by mouth 3 (three) times daily with meals.    [provider]  furosemide (LASIX) 40 MG tablet Take 40 mg by mouth.    [provider]  OMEPRAZOLE PO Take by mouth.    [provider]  oxycodone (OXY-IR) 5 MG capsule Take 5 mg by mouth every 4 (four) hours as needed.    [provider]  oxyCODONE-acetaminophen (ROXICET) 5-325 MG/5ML solution Take by mouth every 4 (four) hours as needed for severe pain.  [provider]  predniSONE (DELTASONE) 20 MG tablet Day 1 and 2: Take 3 tabs  Day 3-5: Take 2 tabs.  Day 5-8: take 1 tab 07/14/17   Mackuen, Courteney Lyn, MD  promethazine (PHENERGAN) 25 MG tablet Take 25 mg by mouth every 6 (six) hours as needed for nausea or vomiting.    [provider]  spironolactone (ALDACTONE) 50 MG tablet Take 50 mg by mouth daily.    [provider]  warfarin (COUMADIN) 5 MG tablet Take 5 mg by mouth daily.    [provider]    Family History History reviewed. No pertinent family history.  Social History Social History   Tobacco Use  . Smoking status: Former Research scientist (life sciences)  . Smokeless tobacco: Never Used  Substance Use Topics  . Alcohol use: No  . Drug use: No     Allergies   Clindamycin/lincomycin; Rocephin [ceftriaxone sodium in  dextrose]; and Vancomycin   Review of Systems Review of Systems All other systems reviewed and are negative except that which was mentioned in HPI   Physical Exam Updated Vital Signs BP 127/85 (BP Location: Left Wrist)   Pulse 83   Temp 98 F (36.7 C) (Oral)   Resp 20   Ht 5\' 7"  (1.702 m)   Wt (!) 170.1 kg (375 lb)   SpO2 96%   BMI 58.73 kg/m   Physical Exam  Constitutional: She is oriented to person, place, and time. She appears well-developed and well-nourished. No distress.  HENT:  Head: Normocephalic and atraumatic.  Moist mucous membranes  Eyes: Conjunctivae are normal. Pupils are equal, round, and reactive to light.  Neck: Neck supple.  Cardiovascular: Normal rate, regular rhythm and normal heart sounds.  No murmur heard. Pulmonary/Chest: Effort normal. No respiratory distress. She has wheezes.  B/l expiratory wheezes  Abdominal: Soft. Bowel sounds are normal. She exhibits no distension. There is no tenderness.  Musculoskeletal: She exhibits tenderness. She exhibits no edema.  Ecchymosis along R arm from humerus down forearm; tenderness of R flank and lateral abdomen  Neurological: She is alert and oriented to person, place, and time.  Fluent speech  Skin: Skin is warm and dry.  Large ecchymosis R arm and R flank  Psychiatric: She has a normal mood and affect. Judgment normal.  Nursing note and vitals reviewed.    ED Treatments / Results  Labs (all labs ordered are listed, but only abnormal results are displayed) Labs Reviewed  COMPREHENSIVE METABOLIC PANEL - Abnormal; Notable for the following components:      Result Value   Glucose, Bld 118 (*)    Creatinine, Ser 1.26 (*)    GFR calc non Af Amer 50 (*)    GFR calc Af Amer 58 (*)    All other components within normal limits  CBC - Abnormal; Notable for the following components:   RDW 16.2 (*)    All other components within normal limits  PROTIME-INR - Abnormal; Notable for the following components:    Prothrombin Time 23.6 (*)    All other components within normal limits    EKG  EKG Interpretation None       Radiology Dg Ribs Unilateral W/chest Right  Result Date: 09/12/2017 CLINICAL DATA:  Pain following fall EXAM: RIGHT RIBS AND CHEST - 3+ VIEW COMPARISON:  Chest radiograph July 14, 2017 FINDINGS: Frontal chest as well as oblique and cone-down rib images were obtained. There is no edema or consolidation. Heart is borderline enlarged with pulmonary vascularity within normal  limits. No adenopathy. There is no evident pneumothorax or pleural effusion. There is no appreciable rib fracture. IMPRESSION: No appreciable rib fracture. No edema or consolidation. Borderline cardiac enlargement. Electronically Signed   By: Lowella Grip III M.D.   On: 09/12/2017 18:13   Ct Chest W Contrast  Result Date: 09/12/2017 CLINICAL DATA:  Fall out of bed yesterday. Blunt trauma and bruising to the right chest and abdomen. EXAM: CT CHEST, ABDOMEN, AND PELVIS WITH CONTRAST TECHNIQUE: Multidetector CT imaging of the chest, abdomen and pelvis was performed following the standard protocol during bolus administration of intravenous contrast. CONTRAST:  177mL ISOVUE-300 IOPAMIDOL (ISOVUE-300) INJECTION 61% COMPARISON:  Chest right rib radiographs earlier this day. Abdominal CT 01/16/2017 FINDINGS: CT CHEST FINDINGS Cardiovascular: No acute aortic injury. Normal caliber thoracic aorta. Similar cardiomegaly and pericardial calcifications. No pericardial fluid. Mediastinum/Nodes: No mediastinal hemorrhage or hematoma. No mediastinal adenopathy. No pneumomediastinum. The esophagus is decompressed. Visualized thyroid gland is normal. Lungs/Pleura: No pneumothorax. No pulmonary effusion. No consolidation. No pleural fluid. Trachea and mainstem bronchi are patent. Musculoskeletal: No fracture of the ribs, sternum, occluded clavicles and shoulder girdles or thoracic spine. Mild multilevel degenerative change in the thoracic  spine. No confluent chest wall contusion. CT ABDOMEN PELVIS FINDINGS Hepatobiliary: No hepatic injury or perihepatic hematoma. The liver is enlarged. Gallbladder is unremarkable Pancreas: No evidence of injury. No ductal dilatation or inflammation. Spleen: No splenic injury or perisplenic hematoma. Prominent in size spanning 13.2 cm. Adrenals/Urinary Tract: No adrenal hemorrhage or renal injury identified. No hydronephrosis. 19 mm cyst in the upper right kidney. Tiny hypodensity in the mid left kidney is too small to characterize but likely cyst. Bladder is only minimally distended but grossly unremarkable. No perivesicular fluid. Stomach/Bowel: No evidence of bowel injury or mesenteric hematoma. No bowel wall thickening or inflammation. No free air or mesenteric fluid. Vascular/Lymphatic: No evidence of injury. The abdominal aorta and IVC are intact. Circumaortic left renal vein. No enlarged abdominal or pelvic lymph nodes. Reproductive: Tubal ligation clips. Uterus no necks are grossly unremarkable, soft tissue attenuation from habitus partially obscures evaluation. Trace free fluid in the pelvis likely physiologic. Other: No free air. Minimal skin thickening and mild subcutaneous edema in the right flank likely contusion. No confluent hematoma. Edema in the lower anterior abdominal wall similar to prior exam and appears chronic. Musculoskeletal: No fracture of the lumbar spine or bony pelvis. Multilevel degenerative change in the spine. IMPRESSION: 1. Mild skin thickening and subcutaneous soft tissue contusion of the right flank. 2. No additional traumatic injury to the chest, abdomen, or pelvis. 3. Incidental findings in the chest include pericardial calcifications. 4. Incidental findings in the abdomen and pelvis include hepatomegaly. Electronically Signed   By: Jeb Levering M.D.   On: 09/12/2017 21:49   Ct Abdomen Pelvis W Contrast  Result Date: 09/12/2017 CLINICAL DATA:  Fall out of bed yesterday.  Blunt trauma and bruising to the right chest and abdomen. EXAM: CT CHEST, ABDOMEN, AND PELVIS WITH CONTRAST TECHNIQUE: Multidetector CT imaging of the chest, abdomen and pelvis was performed following the standard protocol during bolus administration of intravenous contrast. CONTRAST:  119mL ISOVUE-300 IOPAMIDOL (ISOVUE-300) INJECTION 61% COMPARISON:  Chest right rib radiographs earlier this day. Abdominal CT 01/16/2017 FINDINGS: CT CHEST FINDINGS Cardiovascular: No acute aortic injury. Normal caliber thoracic aorta. Similar cardiomegaly and pericardial calcifications. No pericardial fluid. Mediastinum/Nodes: No mediastinal hemorrhage or hematoma. No mediastinal adenopathy. No pneumomediastinum. The esophagus is decompressed. Visualized thyroid gland is normal. Lungs/Pleura: No pneumothorax. No pulmonary effusion.  No consolidation. No pleural fluid. Trachea and mainstem bronchi are patent. Musculoskeletal: No fracture of the ribs, sternum, occluded clavicles and shoulder girdles or thoracic spine. Mild multilevel degenerative change in the thoracic spine. No confluent chest wall contusion. CT ABDOMEN PELVIS FINDINGS Hepatobiliary: No hepatic injury or perihepatic hematoma. The liver is enlarged. Gallbladder is unremarkable Pancreas: No evidence of injury. No ductal dilatation or inflammation. Spleen: No splenic injury or perisplenic hematoma. Prominent in size spanning 13.2 cm. Adrenals/Urinary Tract: No adrenal hemorrhage or renal injury identified. No hydronephrosis. 19 mm cyst in the upper right kidney. Tiny hypodensity in the mid left kidney is too small to characterize but likely cyst. Bladder is only minimally distended but grossly unremarkable. No perivesicular fluid. Stomach/Bowel: No evidence of bowel injury or mesenteric hematoma. No bowel wall thickening or inflammation. No free air or mesenteric fluid. Vascular/Lymphatic: No evidence of injury. The abdominal aorta and IVC are intact. Circumaortic left  renal vein. No enlarged abdominal or pelvic lymph nodes. Reproductive: Tubal ligation clips. Uterus no necks are grossly unremarkable, soft tissue attenuation from habitus partially obscures evaluation. Trace free fluid in the pelvis likely physiologic. Other: No free air. Minimal skin thickening and mild subcutaneous edema in the right flank likely contusion. No confluent hematoma. Edema in the lower anterior abdominal wall similar to prior exam and appears chronic. Musculoskeletal: No fracture of the lumbar spine or bony pelvis. Multilevel degenerative change in the spine. IMPRESSION: 1. Mild skin thickening and subcutaneous soft tissue contusion of the right flank. 2. No additional traumatic injury to the chest, abdomen, or pelvis. 3. Incidental findings in the chest include pericardial calcifications. 4. Incidental findings in the abdomen and pelvis include hepatomegaly. Electronically Signed   By: Jeb Levering M.D.   On: 09/12/2017 21:49    Procedures Procedures (including critical care time)  Medications Ordered in ED Medications  ipratropium-albuterol (DUONEB) 0.5-2.5 (3) MG/3ML nebulizer solution 3 mL (3 mLs Nebulization Given 09/12/17 2015)  dexamethasone (DECADRON) injection 10 mg (10 mg Intravenous Given 09/12/17 2028)  HYDROcodone-acetaminophen (NORCO/VICODIN) 5-325 MG per tablet 2 tablet (2 tablets Oral Given 09/12/17 2014)  iopamidol (ISOVUE-300) 61 % injection 100 mL (100 mLs Intravenous Contrast Given 09/12/17 2119)     Initial Impression / Assessment and Plan / ED Course  I have reviewed the triage vital signs and the nursing notes.  Pertinent labs & imaging results that were available during my care of the patient were reviewed by me and considered in my medical decision making (see chart for details).    PT on coumadin p/w R side pain after fall. Area of tenderness and bruising on R flank, concern for potential internal injury given location and h/o anticoagulation. Labs  reassuring, obtained CT to r/o internal injury such as kidney or chest wall injury. CT negative for significant injuries. She incidentally had some wheezing and I suspect mild COPD exacerbation, gave duoneb and decadron. She has albuterol and understands how often to use. Discussed supportive measures and extensively reviewed return precautions.  Patient voiced understanding and was discharged in satisfactory condition.  Final Clinical Impressions(s) / ED Diagnoses   Final diagnoses:  Contusion of flank, initial encounter  COPD exacerbation [facility])    ED Discharge Orders        Ordered    albuterol (PROVENTIL HFA;VENTOLIN HFA) 108 (90 Base) MCG/ACT inhaler  Every 4 hours PRN     09/12/17 2206       Quanetta Truss, Wenda Overland, MD 09/12/17 2328

## 2017-09-12 NOTE — ED Notes (Signed)
ED Provider at bedside discussing test results and dispo plan of care. 

## 2017-09-12 NOTE — ED Notes (Signed)
Will return to update vitals after Pt gets breathing treatment.

## 2017-09-12 NOTE — ED Triage Notes (Addendum)
Patient reports she fell out of bed yesterday at 0800 in the morning.  Reports pain to right ribs. Denies LOC, head injury.  Patient does currently take 5mg  warfarin

## 2018-01-28 IMAGING — DX DG RIBS W/ CHEST 3+V*R*
3 series · 3 of 3 positions shown · non-contrast
Comparison: Chest radiograph July 14, 2017

CLINICAL DATA: Pain following fall

EXAM:
RIGHT RIBS AND CHEST - 3+ VIEW

[chest pa]
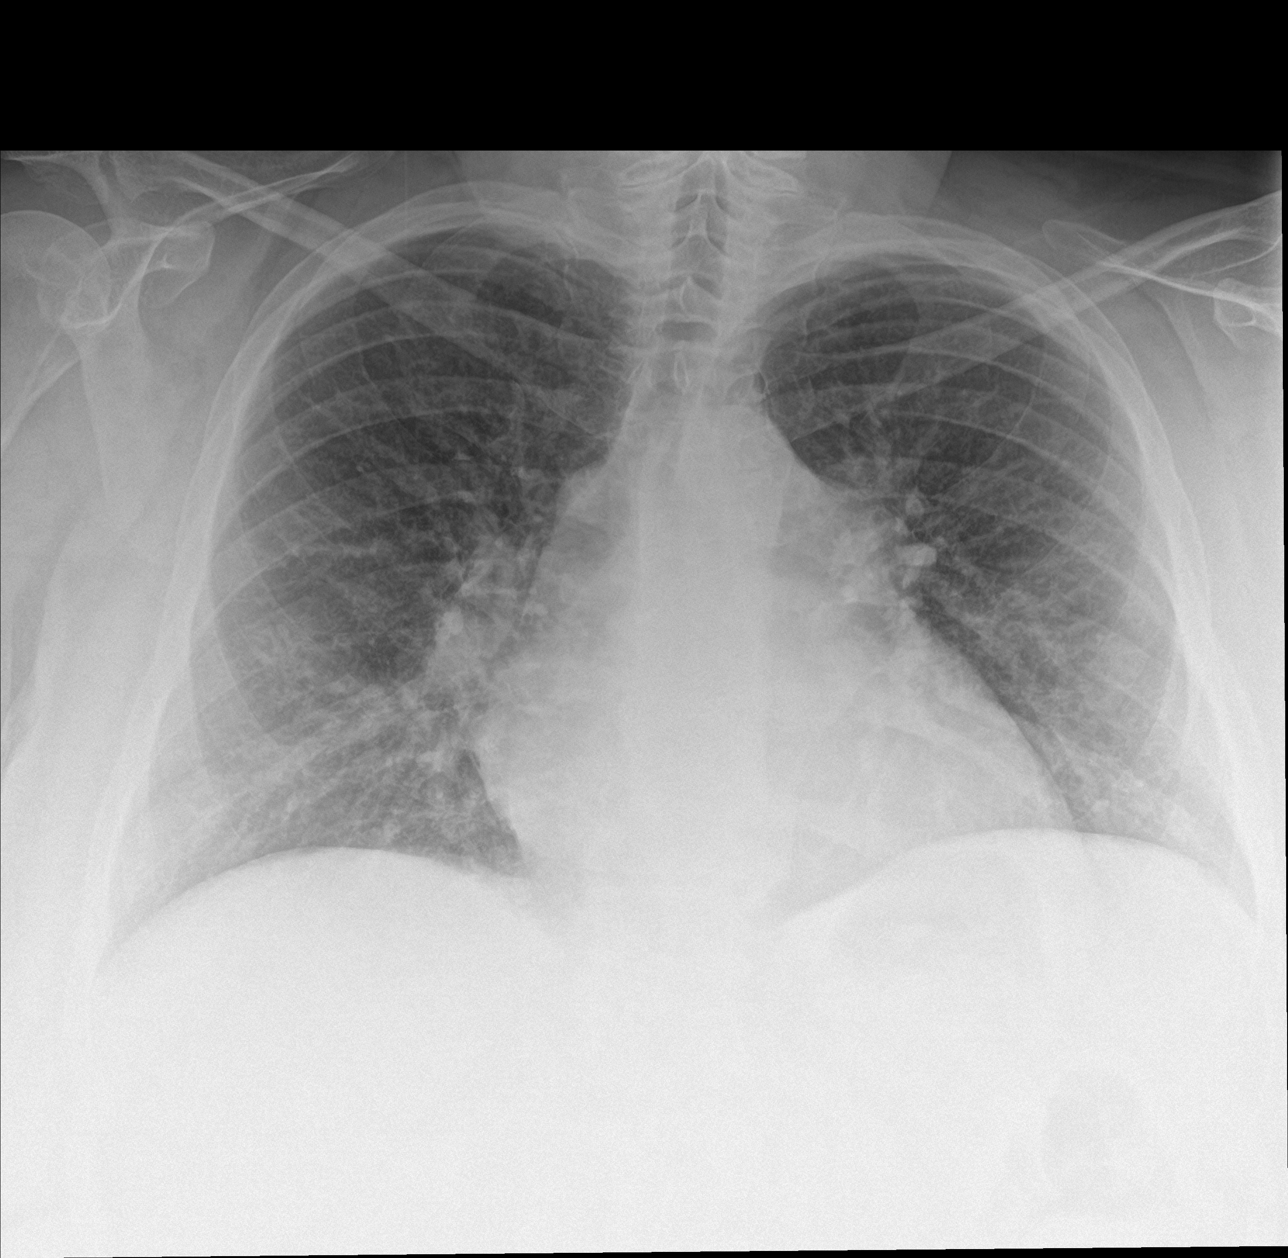

[rib ap]
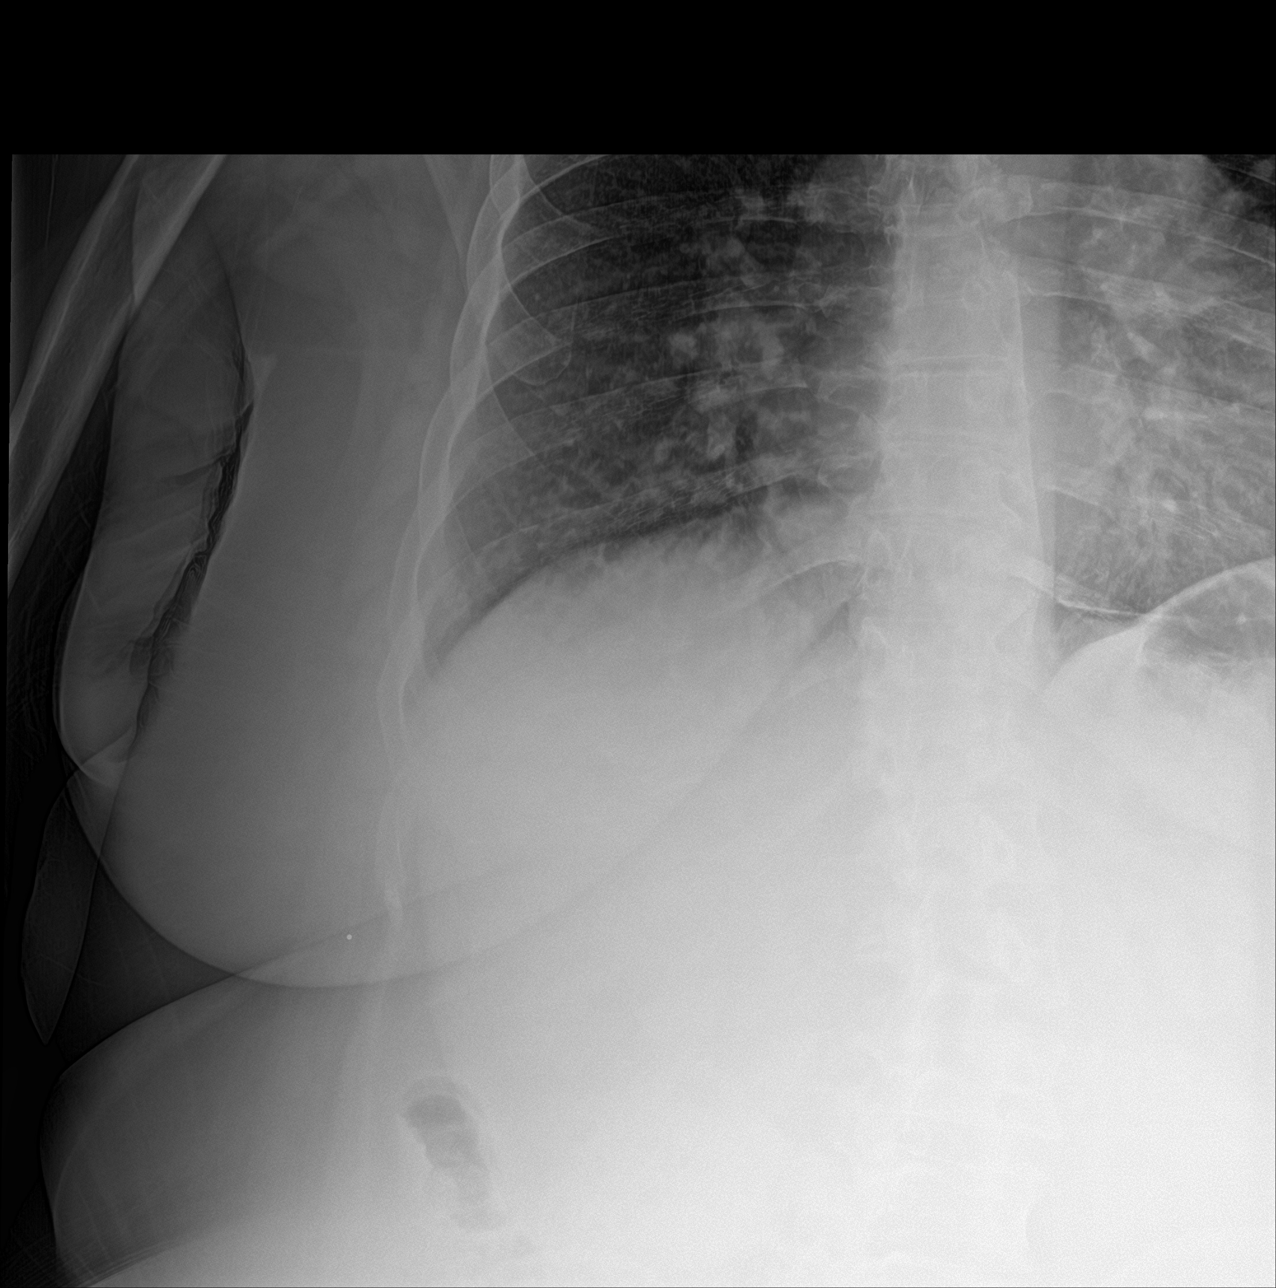

[rib ap obl]
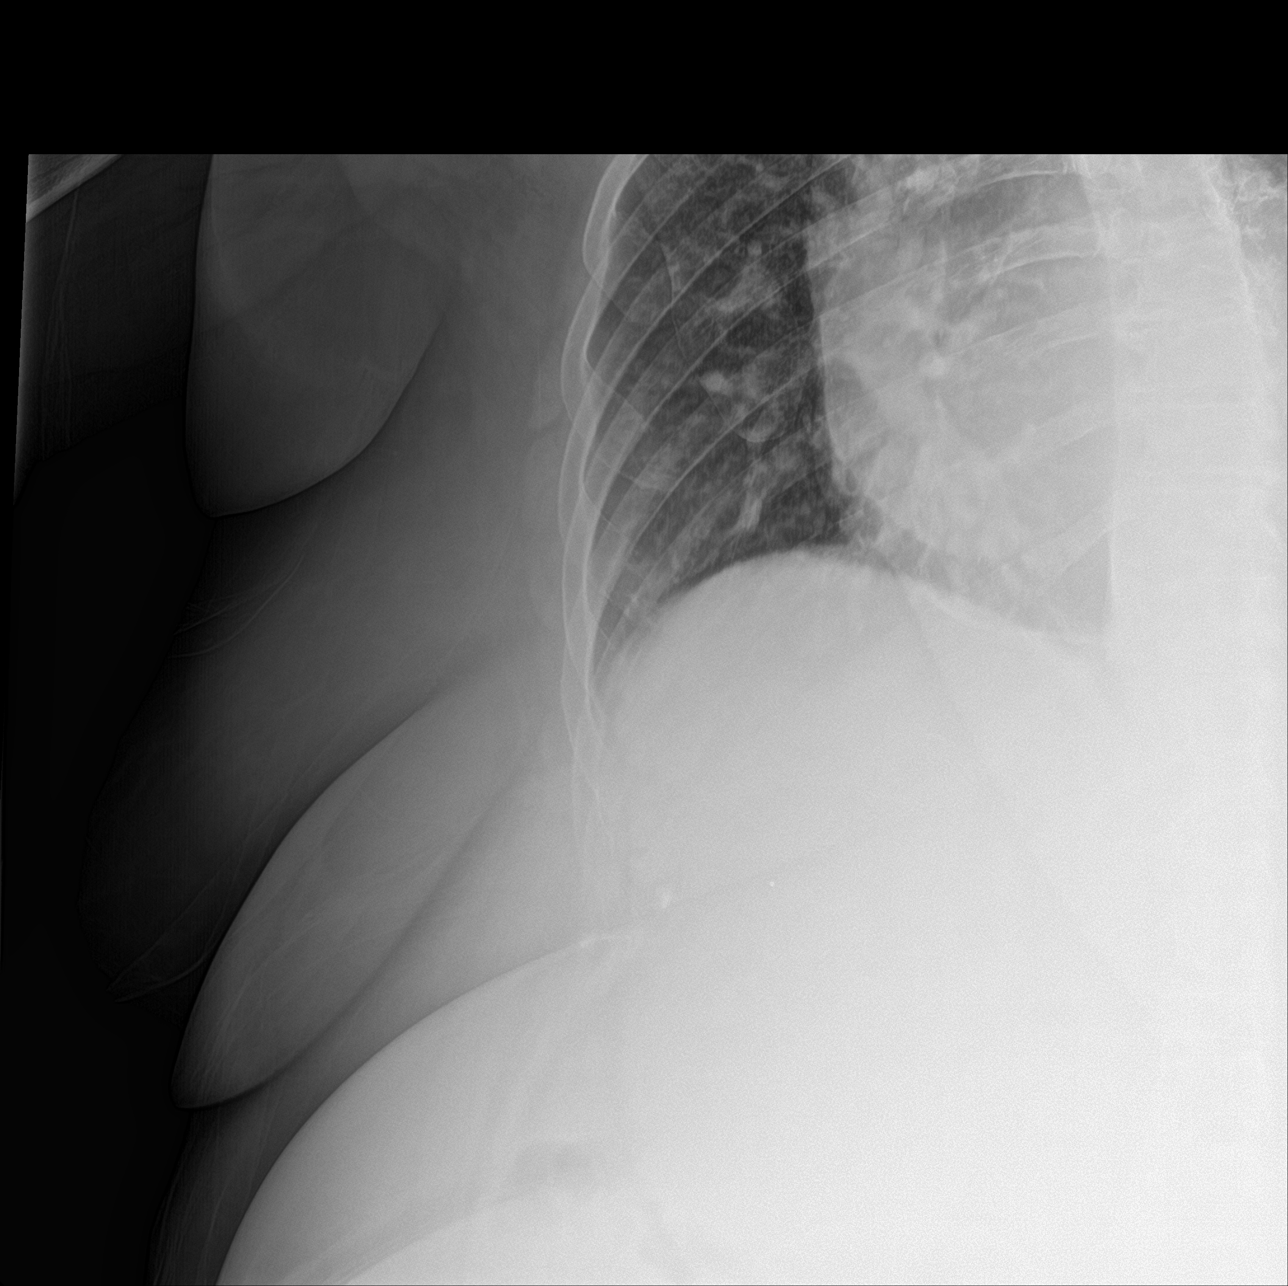

[3 of 3 positions shown; findings below may reference images not displayed]

FINDINGS: Frontal chest as well as oblique and cone-down rib images were
obtained. There is no edema or consolidation. Heart is borderline
enlarged with pulmonary vascularity within normal limits. No
adenopathy.

There is no evident pneumothorax or pleural effusion. There is no
appreciable rib fracture.
IMPRESSION: No appreciable rib fracture. No edema or consolidation. Borderline
cardiac enlargement.

## 2018-09-26 DIAGNOSIS — I4891 Unspecified atrial fibrillation: Secondary | ICD-10-CM | POA: Diagnosis not present

## 2018-09-26 DIAGNOSIS — L03311 Cellulitis of abdominal wall: Secondary | ICD-10-CM | POA: Diagnosis not present

## 2018-09-26 DIAGNOSIS — L03112 Cellulitis of left axilla: Secondary | ICD-10-CM | POA: Diagnosis not present

## 2018-09-26 DIAGNOSIS — Z7901 Long term (current) use of anticoagulants: Secondary | ICD-10-CM | POA: Diagnosis not present

## 2018-10-16 DIAGNOSIS — D373 Neoplasm of uncertain behavior of appendix: Secondary | ICD-10-CM | POA: Diagnosis not present

## 2018-10-16 DIAGNOSIS — R160 Hepatomegaly, not elsewhere classified: Secondary | ICD-10-CM | POA: Diagnosis not present

## 2018-10-23 DIAGNOSIS — R42 Dizziness and giddiness: Secondary | ICD-10-CM | POA: Diagnosis not present

## 2018-10-23 DIAGNOSIS — R062 Wheezing: Secondary | ICD-10-CM | POA: Diagnosis not present

## 2018-10-23 DIAGNOSIS — B9689 Other specified bacterial agents as the cause of diseases classified elsewhere: Secondary | ICD-10-CM | POA: Diagnosis not present

## 2018-10-23 DIAGNOSIS — I4891 Unspecified atrial fibrillation: Secondary | ICD-10-CM | POA: Diagnosis not present

## 2018-10-23 DIAGNOSIS — R112 Nausea with vomiting, unspecified: Secondary | ICD-10-CM | POA: Diagnosis not present

## 2018-10-23 DIAGNOSIS — J209 Acute bronchitis, unspecified: Secondary | ICD-10-CM | POA: Diagnosis not present

## 2018-10-23 DIAGNOSIS — J208 Acute bronchitis due to other specified organisms: Secondary | ICD-10-CM | POA: Diagnosis not present

## 2018-10-23 DIAGNOSIS — R509 Fever, unspecified: Secondary | ICD-10-CM | POA: Diagnosis not present

## 2018-10-23 DIAGNOSIS — R05 Cough: Secondary | ICD-10-CM | POA: Diagnosis not present

## 2018-10-23 DIAGNOSIS — I110 Hypertensive heart disease with heart failure: Secondary | ICD-10-CM | POA: Diagnosis not present

## 2018-10-23 DIAGNOSIS — I509 Heart failure, unspecified: Secondary | ICD-10-CM | POA: Diagnosis not present

## 2018-10-23 DIAGNOSIS — R51 Headache: Secondary | ICD-10-CM | POA: Diagnosis not present

## 2018-10-29 DIAGNOSIS — I2510 Atherosclerotic heart disease of native coronary artery without angina pectoris: Secondary | ICD-10-CM | POA: Diagnosis not present

## 2018-10-29 DIAGNOSIS — Z7901 Long term (current) use of anticoagulants: Secondary | ICD-10-CM | POA: Diagnosis not present

## 2018-10-29 DIAGNOSIS — I890 Lymphedema, not elsewhere classified: Secondary | ICD-10-CM | POA: Diagnosis not present

## 2018-11-13 DIAGNOSIS — R296 Repeated falls: Secondary | ICD-10-CM | POA: Diagnosis not present

## 2018-11-13 DIAGNOSIS — I5043 Acute on chronic combined systolic (congestive) and diastolic (congestive) heart failure: Secondary | ICD-10-CM | POA: Diagnosis not present

## 2018-11-13 DIAGNOSIS — L03115 Cellulitis of right lower limb: Secondary | ICD-10-CM | POA: Diagnosis not present

## 2018-11-13 DIAGNOSIS — L03116 Cellulitis of left lower limb: Secondary | ICD-10-CM | POA: Diagnosis not present

## 2018-11-13 DIAGNOSIS — R531 Weakness: Secondary | ICD-10-CM | POA: Diagnosis not present

## 2018-11-13 DIAGNOSIS — G459 Transient cerebral ischemic attack, unspecified: Secondary | ICD-10-CM | POA: Diagnosis not present

## 2018-11-13 DIAGNOSIS — Y9389 Activity, other specified: Secondary | ICD-10-CM | POA: Diagnosis not present

## 2018-11-13 DIAGNOSIS — J181 Lobar pneumonia, unspecified organism: Secondary | ICD-10-CM | POA: Diagnosis not present

## 2018-11-13 DIAGNOSIS — W1812XA Fall from or off toilet with subsequent striking against object, initial encounter: Secondary | ICD-10-CM | POA: Diagnosis not present

## 2018-11-13 DIAGNOSIS — R29818 Other symptoms and signs involving the nervous system: Secondary | ICD-10-CM | POA: Diagnosis not present

## 2018-11-13 DIAGNOSIS — J156 Pneumonia due to other aerobic Gram-negative bacteria: Secondary | ICD-10-CM | POA: Diagnosis not present

## 2018-11-13 DIAGNOSIS — I4891 Unspecified atrial fibrillation: Secondary | ICD-10-CM | POA: Diagnosis not present

## 2018-11-13 DIAGNOSIS — Y92002 Bathroom of unspecified non-institutional (private) residence single-family (private) house as the place of occurrence of the external cause: Secondary | ICD-10-CM | POA: Diagnosis not present

## 2018-11-13 DIAGNOSIS — W04XXXA Fall while being carried or supported by other persons, initial encounter: Secondary | ICD-10-CM | POA: Diagnosis not present

## 2018-11-13 DIAGNOSIS — R4781 Slurred speech: Secondary | ICD-10-CM | POA: Diagnosis not present

## 2018-11-14 DIAGNOSIS — W04XXXA Fall while being carried or supported by other persons, initial encounter: Secondary | ICD-10-CM | POA: Diagnosis not present

## 2018-11-14 DIAGNOSIS — Y9389 Activity, other specified: Secondary | ICD-10-CM | POA: Diagnosis not present

## 2018-11-14 DIAGNOSIS — I5043 Acute on chronic combined systolic (congestive) and diastolic (congestive) heart failure: Secondary | ICD-10-CM | POA: Diagnosis not present

## 2018-11-14 DIAGNOSIS — J156 Pneumonia due to other aerobic Gram-negative bacteria: Secondary | ICD-10-CM | POA: Diagnosis not present

## 2018-11-14 DIAGNOSIS — R29818 Other symptoms and signs involving the nervous system: Secondary | ICD-10-CM | POA: Diagnosis not present

## 2018-11-14 DIAGNOSIS — W1812XA Fall from or off toilet with subsequent striking against object, initial encounter: Secondary | ICD-10-CM | POA: Diagnosis not present

## 2018-11-14 DIAGNOSIS — R4781 Slurred speech: Secondary | ICD-10-CM | POA: Diagnosis not present

## 2018-11-14 DIAGNOSIS — G459 Transient cerebral ischemic attack, unspecified: Secondary | ICD-10-CM | POA: Diagnosis not present

## 2018-11-14 DIAGNOSIS — L03115 Cellulitis of right lower limb: Secondary | ICD-10-CM | POA: Diagnosis not present

## 2018-11-14 DIAGNOSIS — L03116 Cellulitis of left lower limb: Secondary | ICD-10-CM | POA: Diagnosis not present

## 2018-11-14 DIAGNOSIS — Y92002 Bathroom of unspecified non-institutional (private) residence single-family (private) house as the place of occurrence of the external cause: Secondary | ICD-10-CM | POA: Diagnosis not present

## 2018-11-15 DIAGNOSIS — I872 Venous insufficiency (chronic) (peripheral): Secondary | ICD-10-CM | POA: Diagnosis not present

## 2018-11-15 DIAGNOSIS — R7309 Other abnormal glucose: Secondary | ICD-10-CM | POA: Diagnosis not present

## 2018-11-15 DIAGNOSIS — J181 Lobar pneumonia, unspecified organism: Secondary | ICD-10-CM | POA: Diagnosis not present

## 2018-11-15 DIAGNOSIS — G459 Transient cerebral ischemic attack, unspecified: Secondary | ICD-10-CM | POA: Diagnosis not present

## 2018-11-15 DIAGNOSIS — Z6841 Body Mass Index (BMI) 40.0 and over, adult: Secondary | ICD-10-CM | POA: Diagnosis not present

## 2018-11-15 DIAGNOSIS — Z7901 Long term (current) use of anticoagulants: Secondary | ICD-10-CM | POA: Diagnosis not present

## 2018-11-15 DIAGNOSIS — E876 Hypokalemia: Secondary | ICD-10-CM | POA: Diagnosis not present

## 2018-11-16 DIAGNOSIS — I4811 Longstanding persistent atrial fibrillation: Secondary | ICD-10-CM | POA: Diagnosis not present

## 2018-11-16 DIAGNOSIS — G459 Transient cerebral ischemic attack, unspecified: Secondary | ICD-10-CM | POA: Diagnosis not present

## 2018-11-16 DIAGNOSIS — Z7901 Long term (current) use of anticoagulants: Secondary | ICD-10-CM | POA: Diagnosis not present

## 2018-11-17 DIAGNOSIS — L03115 Cellulitis of right lower limb: Secondary | ICD-10-CM | POA: Diagnosis not present

## 2018-11-17 DIAGNOSIS — D6832 Hemorrhagic disorder due to extrinsic circulating anticoagulants: Secondary | ICD-10-CM | POA: Diagnosis not present

## 2018-11-17 DIAGNOSIS — E871 Hypo-osmolality and hyponatremia: Secondary | ICD-10-CM | POA: Diagnosis not present

## 2018-11-17 DIAGNOSIS — G459 Transient cerebral ischemic attack, unspecified: Secondary | ICD-10-CM | POA: Diagnosis not present

## 2018-11-17 DIAGNOSIS — L03116 Cellulitis of left lower limb: Secondary | ICD-10-CM | POA: Diagnosis not present

## 2018-11-17 DIAGNOSIS — I4811 Longstanding persistent atrial fibrillation: Secondary | ICD-10-CM | POA: Diagnosis not present

## 2018-11-17 DIAGNOSIS — N3001 Acute cystitis with hematuria: Secondary | ICD-10-CM | POA: Diagnosis not present

## 2018-11-17 DIAGNOSIS — I5043 Acute on chronic combined systolic (congestive) and diastolic (congestive) heart failure: Secondary | ICD-10-CM | POA: Diagnosis not present

## 2018-11-17 DIAGNOSIS — D62 Acute posthemorrhagic anemia: Secondary | ICD-10-CM | POA: Diagnosis not present

## 2018-11-17 DIAGNOSIS — Z7901 Long term (current) use of anticoagulants: Secondary | ICD-10-CM | POA: Diagnosis not present

## 2018-11-17 DIAGNOSIS — J156 Pneumonia due to other aerobic Gram-negative bacteria: Secondary | ICD-10-CM | POA: Diagnosis not present

## 2018-11-18 DIAGNOSIS — R319 Hematuria, unspecified: Secondary | ICD-10-CM | POA: Diagnosis not present

## 2018-11-18 DIAGNOSIS — R0603 Acute respiratory distress: Secondary | ICD-10-CM | POA: Diagnosis not present

## 2018-11-18 DIAGNOSIS — I088 Other rheumatic multiple valve diseases: Secondary | ICD-10-CM | POA: Diagnosis not present

## 2018-11-18 DIAGNOSIS — W1812XA Fall from or off toilet with subsequent striking against object, initial encounter: Secondary | ICD-10-CM | POA: Diagnosis not present

## 2018-11-18 DIAGNOSIS — K5900 Constipation, unspecified: Secondary | ICD-10-CM | POA: Diagnosis not present

## 2018-11-18 DIAGNOSIS — Y9389 Activity, other specified: Secondary | ICD-10-CM | POA: Diagnosis not present

## 2018-11-18 DIAGNOSIS — Z6841 Body Mass Index (BMI) 40.0 and over, adult: Secondary | ICD-10-CM | POA: Diagnosis not present

## 2018-11-18 DIAGNOSIS — D62 Acute posthemorrhagic anemia: Secondary | ICD-10-CM | POA: Diagnosis not present

## 2018-11-18 DIAGNOSIS — R4781 Slurred speech: Secondary | ICD-10-CM | POA: Diagnosis not present

## 2018-11-18 DIAGNOSIS — I5043 Acute on chronic combined systolic (congestive) and diastolic (congestive) heart failure: Secondary | ICD-10-CM | POA: Diagnosis not present

## 2018-11-18 DIAGNOSIS — J156 Pneumonia due to other aerobic Gram-negative bacteria: Secondary | ICD-10-CM | POA: Diagnosis not present

## 2018-11-18 DIAGNOSIS — E782 Mixed hyperlipidemia: Secondary | ICD-10-CM | POA: Diagnosis not present

## 2018-11-18 DIAGNOSIS — I5021 Acute systolic (congestive) heart failure: Secondary | ICD-10-CM | POA: Diagnosis not present

## 2018-11-18 DIAGNOSIS — D6832 Hemorrhagic disorder due to extrinsic circulating anticoagulants: Secondary | ICD-10-CM | POA: Diagnosis not present

## 2018-11-18 DIAGNOSIS — L03116 Cellulitis of left lower limb: Secondary | ICD-10-CM | POA: Diagnosis not present

## 2018-11-18 DIAGNOSIS — I4891 Unspecified atrial fibrillation: Secondary | ICD-10-CM | POA: Diagnosis not present

## 2018-11-18 DIAGNOSIS — G459 Transient cerebral ischemic attack, unspecified: Secondary | ICD-10-CM | POA: Diagnosis not present

## 2018-11-18 DIAGNOSIS — I10 Essential (primary) hypertension: Secondary | ICD-10-CM | POA: Diagnosis not present

## 2018-11-18 DIAGNOSIS — E871 Hypo-osmolality and hyponatremia: Secondary | ICD-10-CM | POA: Diagnosis not present

## 2018-11-18 DIAGNOSIS — S3023XA Contusion of vagina and vulva, initial encounter: Secondary | ICD-10-CM | POA: Diagnosis not present

## 2018-11-18 DIAGNOSIS — S3023XD Contusion of vagina and vulva, subsequent encounter: Secondary | ICD-10-CM | POA: Diagnosis not present

## 2018-11-18 DIAGNOSIS — I480 Paroxysmal atrial fibrillation: Secondary | ICD-10-CM | POA: Diagnosis not present

## 2018-11-18 DIAGNOSIS — W04XXXA Fall while being carried or supported by other persons, initial encounter: Secondary | ICD-10-CM | POA: Diagnosis not present

## 2018-11-18 DIAGNOSIS — I4811 Longstanding persistent atrial fibrillation: Secondary | ICD-10-CM | POA: Diagnosis not present

## 2018-11-18 DIAGNOSIS — N3001 Acute cystitis with hematuria: Secondary | ICD-10-CM | POA: Diagnosis not present

## 2018-11-18 DIAGNOSIS — L03115 Cellulitis of right lower limb: Secondary | ICD-10-CM | POA: Diagnosis not present

## 2018-11-18 DIAGNOSIS — Y92002 Bathroom of unspecified non-institutional (private) residence single-family (private) house as the place of occurrence of the external cause: Secondary | ICD-10-CM | POA: Diagnosis not present

## 2018-11-18 DIAGNOSIS — I482 Chronic atrial fibrillation, unspecified: Secondary | ICD-10-CM | POA: Diagnosis not present

## 2018-11-18 DIAGNOSIS — Z86711 Personal history of pulmonary embolism: Secondary | ICD-10-CM | POA: Diagnosis not present

## 2018-11-18 DIAGNOSIS — J189 Pneumonia, unspecified organism: Secondary | ICD-10-CM | POA: Diagnosis not present

## 2018-11-18 DIAGNOSIS — I517 Cardiomegaly: Secondary | ICD-10-CM | POA: Diagnosis not present

## 2018-11-18 DIAGNOSIS — Z7901 Long term (current) use of anticoagulants: Secondary | ICD-10-CM | POA: Diagnosis not present

## 2018-11-18 DIAGNOSIS — I5033 Acute on chronic diastolic (congestive) heart failure: Secondary | ICD-10-CM | POA: Diagnosis not present

## 2018-11-18 DIAGNOSIS — B961 Klebsiella pneumoniae [K. pneumoniae] as the cause of diseases classified elsewhere: Secondary | ICD-10-CM | POA: Diagnosis not present

## 2018-11-18 DIAGNOSIS — A491 Streptococcal infection, unspecified site: Secondary | ICD-10-CM | POA: Diagnosis not present

## 2018-11-19 DIAGNOSIS — N3001 Acute cystitis with hematuria: Secondary | ICD-10-CM | POA: Diagnosis not present

## 2018-11-19 DIAGNOSIS — L03116 Cellulitis of left lower limb: Secondary | ICD-10-CM | POA: Diagnosis not present

## 2018-11-19 DIAGNOSIS — R319 Hematuria, unspecified: Secondary | ICD-10-CM | POA: Diagnosis not present

## 2018-11-19 DIAGNOSIS — G459 Transient cerebral ischemic attack, unspecified: Secondary | ICD-10-CM | POA: Diagnosis not present

## 2018-11-19 DIAGNOSIS — I4811 Longstanding persistent atrial fibrillation: Secondary | ICD-10-CM | POA: Diagnosis not present

## 2018-11-19 DIAGNOSIS — L03115 Cellulitis of right lower limb: Secondary | ICD-10-CM | POA: Diagnosis not present

## 2018-11-19 DIAGNOSIS — I5033 Acute on chronic diastolic (congestive) heart failure: Secondary | ICD-10-CM | POA: Diagnosis not present

## 2018-11-19 DIAGNOSIS — K5900 Constipation, unspecified: Secondary | ICD-10-CM | POA: Diagnosis not present

## 2018-11-19 DIAGNOSIS — I4891 Unspecified atrial fibrillation: Secondary | ICD-10-CM | POA: Diagnosis not present

## 2018-11-19 DIAGNOSIS — I5021 Acute systolic (congestive) heart failure: Secondary | ICD-10-CM | POA: Diagnosis not present

## 2018-11-19 DIAGNOSIS — I482 Chronic atrial fibrillation, unspecified: Secondary | ICD-10-CM | POA: Diagnosis not present

## 2018-11-19 DIAGNOSIS — Z7901 Long term (current) use of anticoagulants: Secondary | ICD-10-CM | POA: Diagnosis not present

## 2018-11-19 DIAGNOSIS — I10 Essential (primary) hypertension: Secondary | ICD-10-CM | POA: Diagnosis not present

## 2018-11-20 DIAGNOSIS — L03115 Cellulitis of right lower limb: Secondary | ICD-10-CM | POA: Diagnosis not present

## 2018-11-20 DIAGNOSIS — I4891 Unspecified atrial fibrillation: Secondary | ICD-10-CM | POA: Diagnosis not present

## 2018-11-20 DIAGNOSIS — I4811 Longstanding persistent atrial fibrillation: Secondary | ICD-10-CM | POA: Diagnosis not present

## 2018-11-20 DIAGNOSIS — S3023XA Contusion of vagina and vulva, initial encounter: Secondary | ICD-10-CM | POA: Diagnosis not present

## 2018-11-20 DIAGNOSIS — I5021 Acute systolic (congestive) heart failure: Secondary | ICD-10-CM | POA: Diagnosis not present

## 2018-11-20 DIAGNOSIS — Z86711 Personal history of pulmonary embolism: Secondary | ICD-10-CM | POA: Diagnosis not present

## 2018-11-20 DIAGNOSIS — E782 Mixed hyperlipidemia: Secondary | ICD-10-CM | POA: Diagnosis not present

## 2018-11-20 DIAGNOSIS — I480 Paroxysmal atrial fibrillation: Secondary | ICD-10-CM | POA: Diagnosis not present

## 2018-11-20 DIAGNOSIS — G459 Transient cerebral ischemic attack, unspecified: Secondary | ICD-10-CM | POA: Diagnosis not present

## 2018-11-20 DIAGNOSIS — R0603 Acute respiratory distress: Secondary | ICD-10-CM | POA: Diagnosis not present

## 2018-11-20 DIAGNOSIS — N3001 Acute cystitis with hematuria: Secondary | ICD-10-CM | POA: Diagnosis not present

## 2018-11-20 DIAGNOSIS — L03116 Cellulitis of left lower limb: Secondary | ICD-10-CM | POA: Diagnosis not present

## 2018-11-20 DIAGNOSIS — Z7901 Long term (current) use of anticoagulants: Secondary | ICD-10-CM | POA: Diagnosis not present

## 2018-11-20 DIAGNOSIS — I10 Essential (primary) hypertension: Secondary | ICD-10-CM | POA: Diagnosis not present

## 2018-11-20 DIAGNOSIS — J189 Pneumonia, unspecified organism: Secondary | ICD-10-CM | POA: Diagnosis not present

## 2018-11-21 DIAGNOSIS — S3023XA Contusion of vagina and vulva, initial encounter: Secondary | ICD-10-CM | POA: Diagnosis not present

## 2018-11-21 DIAGNOSIS — L03115 Cellulitis of right lower limb: Secondary | ICD-10-CM | POA: Diagnosis not present

## 2018-11-21 DIAGNOSIS — Z6841 Body Mass Index (BMI) 40.0 and over, adult: Secondary | ICD-10-CM | POA: Diagnosis not present

## 2018-11-21 DIAGNOSIS — L03116 Cellulitis of left lower limb: Secondary | ICD-10-CM | POA: Diagnosis not present

## 2018-11-21 DIAGNOSIS — J156 Pneumonia due to other aerobic Gram-negative bacteria: Secondary | ICD-10-CM | POA: Diagnosis not present

## 2018-11-21 DIAGNOSIS — G459 Transient cerebral ischemic attack, unspecified: Secondary | ICD-10-CM | POA: Diagnosis not present

## 2018-11-21 DIAGNOSIS — I10 Essential (primary) hypertension: Secondary | ICD-10-CM | POA: Diagnosis not present

## 2018-11-21 DIAGNOSIS — I482 Chronic atrial fibrillation, unspecified: Secondary | ICD-10-CM | POA: Diagnosis not present

## 2018-11-21 DIAGNOSIS — I5033 Acute on chronic diastolic (congestive) heart failure: Secondary | ICD-10-CM | POA: Diagnosis not present

## 2018-11-21 DIAGNOSIS — I5043 Acute on chronic combined systolic (congestive) and diastolic (congestive) heart failure: Secondary | ICD-10-CM | POA: Diagnosis not present

## 2018-11-21 DIAGNOSIS — B961 Klebsiella pneumoniae [K. pneumoniae] as the cause of diseases classified elsewhere: Secondary | ICD-10-CM | POA: Diagnosis not present

## 2018-11-21 DIAGNOSIS — I4891 Unspecified atrial fibrillation: Secondary | ICD-10-CM | POA: Diagnosis not present

## 2018-11-21 DIAGNOSIS — A491 Streptococcal infection, unspecified site: Secondary | ICD-10-CM | POA: Diagnosis not present

## 2018-11-21 DIAGNOSIS — D6832 Hemorrhagic disorder due to extrinsic circulating anticoagulants: Secondary | ICD-10-CM | POA: Diagnosis not present

## 2018-11-21 DIAGNOSIS — D62 Acute posthemorrhagic anemia: Secondary | ICD-10-CM | POA: Diagnosis not present

## 2018-11-21 DIAGNOSIS — E871 Hypo-osmolality and hyponatremia: Secondary | ICD-10-CM | POA: Diagnosis not present

## 2018-11-21 DIAGNOSIS — N3001 Acute cystitis with hematuria: Secondary | ICD-10-CM | POA: Diagnosis not present

## 2018-11-22 DIAGNOSIS — I5043 Acute on chronic combined systolic (congestive) and diastolic (congestive) heart failure: Secondary | ICD-10-CM | POA: Diagnosis not present

## 2018-11-22 DIAGNOSIS — A491 Streptococcal infection, unspecified site: Secondary | ICD-10-CM | POA: Diagnosis not present

## 2018-11-22 DIAGNOSIS — W1812XA Fall from or off toilet with subsequent striking against object, initial encounter: Secondary | ICD-10-CM | POA: Diagnosis not present

## 2018-11-22 DIAGNOSIS — Y92002 Bathroom of unspecified non-institutional (private) residence single-family (private) house as the place of occurrence of the external cause: Secondary | ICD-10-CM | POA: Diagnosis not present

## 2018-11-22 DIAGNOSIS — N3001 Acute cystitis with hematuria: Secondary | ICD-10-CM | POA: Diagnosis not present

## 2018-11-22 DIAGNOSIS — J156 Pneumonia due to other aerobic Gram-negative bacteria: Secondary | ICD-10-CM | POA: Diagnosis not present

## 2018-11-22 DIAGNOSIS — I517 Cardiomegaly: Secondary | ICD-10-CM | POA: Diagnosis not present

## 2018-11-22 DIAGNOSIS — W04XXXA Fall while being carried or supported by other persons, initial encounter: Secondary | ICD-10-CM | POA: Diagnosis not present

## 2018-11-22 DIAGNOSIS — S3023XA Contusion of vagina and vulva, initial encounter: Secondary | ICD-10-CM | POA: Diagnosis not present

## 2018-11-22 DIAGNOSIS — E782 Mixed hyperlipidemia: Secondary | ICD-10-CM | POA: Diagnosis not present

## 2018-11-22 DIAGNOSIS — L03115 Cellulitis of right lower limb: Secondary | ICD-10-CM | POA: Diagnosis not present

## 2018-11-22 DIAGNOSIS — G459 Transient cerebral ischemic attack, unspecified: Secondary | ICD-10-CM | POA: Diagnosis not present

## 2018-11-22 DIAGNOSIS — Y9389 Activity, other specified: Secondary | ICD-10-CM | POA: Diagnosis not present

## 2018-11-22 DIAGNOSIS — I480 Paroxysmal atrial fibrillation: Secondary | ICD-10-CM | POA: Diagnosis not present

## 2018-11-22 DIAGNOSIS — B961 Klebsiella pneumoniae [K. pneumoniae] as the cause of diseases classified elsewhere: Secondary | ICD-10-CM | POA: Diagnosis not present

## 2018-11-22 DIAGNOSIS — I4891 Unspecified atrial fibrillation: Secondary | ICD-10-CM | POA: Diagnosis not present

## 2018-11-22 DIAGNOSIS — I088 Other rheumatic multiple valve diseases: Secondary | ICD-10-CM | POA: Diagnosis not present

## 2018-11-22 DIAGNOSIS — L03116 Cellulitis of left lower limb: Secondary | ICD-10-CM | POA: Diagnosis not present

## 2018-11-22 DIAGNOSIS — R4781 Slurred speech: Secondary | ICD-10-CM | POA: Diagnosis not present

## 2018-11-23 DIAGNOSIS — S3023XA Contusion of vagina and vulva, initial encounter: Secondary | ICD-10-CM | POA: Diagnosis not present

## 2018-11-24 DIAGNOSIS — S3023XA Contusion of vagina and vulva, initial encounter: Secondary | ICD-10-CM | POA: Diagnosis not present

## 2018-11-24 DIAGNOSIS — G459 Transient cerebral ischemic attack, unspecified: Secondary | ICD-10-CM | POA: Diagnosis not present

## 2018-11-24 DIAGNOSIS — Y9389 Activity, other specified: Secondary | ICD-10-CM | POA: Diagnosis not present

## 2018-11-24 DIAGNOSIS — L03115 Cellulitis of right lower limb: Secondary | ICD-10-CM | POA: Diagnosis not present

## 2018-11-24 DIAGNOSIS — I5043 Acute on chronic combined systolic (congestive) and diastolic (congestive) heart failure: Secondary | ICD-10-CM | POA: Diagnosis not present

## 2018-11-24 DIAGNOSIS — L03116 Cellulitis of left lower limb: Secondary | ICD-10-CM | POA: Diagnosis not present

## 2018-11-24 DIAGNOSIS — Y92002 Bathroom of unspecified non-institutional (private) residence single-family (private) house as the place of occurrence of the external cause: Secondary | ICD-10-CM | POA: Diagnosis not present

## 2018-11-24 DIAGNOSIS — W1812XA Fall from or off toilet with subsequent striking against object, initial encounter: Secondary | ICD-10-CM | POA: Diagnosis not present

## 2018-11-24 DIAGNOSIS — W04XXXA Fall while being carried or supported by other persons, initial encounter: Secondary | ICD-10-CM | POA: Diagnosis not present

## 2018-11-24 DIAGNOSIS — S3023XD Contusion of vagina and vulva, subsequent encounter: Secondary | ICD-10-CM | POA: Diagnosis not present

## 2018-11-24 DIAGNOSIS — J156 Pneumonia due to other aerobic Gram-negative bacteria: Secondary | ICD-10-CM | POA: Diagnosis not present

## 2018-11-24 DIAGNOSIS — R4781 Slurred speech: Secondary | ICD-10-CM | POA: Diagnosis not present

## 2018-11-25 DIAGNOSIS — S3023XA Contusion of vagina and vulva, initial encounter: Secondary | ICD-10-CM | POA: Diagnosis not present

## 2018-11-25 DIAGNOSIS — S3023XD Contusion of vagina and vulva, subsequent encounter: Secondary | ICD-10-CM | POA: Diagnosis not present

## 2018-11-26 DIAGNOSIS — S3023XA Contusion of vagina and vulva, initial encounter: Secondary | ICD-10-CM | POA: Diagnosis not present

## 2018-11-27 DIAGNOSIS — W04XXXA Fall while being carried or supported by other persons, initial encounter: Secondary | ICD-10-CM | POA: Diagnosis not present

## 2018-11-27 DIAGNOSIS — L03116 Cellulitis of left lower limb: Secondary | ICD-10-CM | POA: Diagnosis not present

## 2018-11-27 DIAGNOSIS — I5043 Acute on chronic combined systolic (congestive) and diastolic (congestive) heart failure: Secondary | ICD-10-CM | POA: Diagnosis not present

## 2018-11-27 DIAGNOSIS — G459 Transient cerebral ischemic attack, unspecified: Secondary | ICD-10-CM | POA: Diagnosis not present

## 2018-11-27 DIAGNOSIS — Y9389 Activity, other specified: Secondary | ICD-10-CM | POA: Diagnosis not present

## 2018-11-27 DIAGNOSIS — W1812XA Fall from or off toilet with subsequent striking against object, initial encounter: Secondary | ICD-10-CM | POA: Diagnosis not present

## 2018-11-27 DIAGNOSIS — S3023XA Contusion of vagina and vulva, initial encounter: Secondary | ICD-10-CM | POA: Diagnosis not present

## 2018-11-27 DIAGNOSIS — L03115 Cellulitis of right lower limb: Secondary | ICD-10-CM | POA: Diagnosis not present

## 2018-11-27 DIAGNOSIS — Y92002 Bathroom of unspecified non-institutional (private) residence single-family (private) house as the place of occurrence of the external cause: Secondary | ICD-10-CM | POA: Diagnosis not present

## 2018-11-27 DIAGNOSIS — R4781 Slurred speech: Secondary | ICD-10-CM | POA: Diagnosis not present

## 2018-11-27 DIAGNOSIS — J156 Pneumonia due to other aerobic Gram-negative bacteria: Secondary | ICD-10-CM | POA: Diagnosis not present

## 2018-11-28 DIAGNOSIS — S3023XD Contusion of vagina and vulva, subsequent encounter: Secondary | ICD-10-CM | POA: Diagnosis not present

## 2018-11-28 DIAGNOSIS — R918 Other nonspecific abnormal finding of lung field: Secondary | ICD-10-CM | POA: Diagnosis not present

## 2018-11-28 DIAGNOSIS — S3095XS Unspecified superficial injury of vagina and vulva, sequela: Secondary | ICD-10-CM | POA: Diagnosis not present

## 2018-11-28 DIAGNOSIS — I517 Cardiomegaly: Secondary | ICD-10-CM | POA: Diagnosis not present

## 2018-11-28 DIAGNOSIS — M2550 Pain in unspecified joint: Secondary | ICD-10-CM | POA: Diagnosis not present

## 2018-11-28 DIAGNOSIS — J189 Pneumonia, unspecified organism: Secondary | ICD-10-CM | POA: Diagnosis not present

## 2018-11-28 DIAGNOSIS — W19XXXD Unspecified fall, subsequent encounter: Secondary | ICD-10-CM | POA: Diagnosis not present

## 2018-11-28 DIAGNOSIS — R51 Headache: Secondary | ICD-10-CM | POA: Diagnosis not present

## 2018-11-28 DIAGNOSIS — I10 Essential (primary) hypertension: Secondary | ICD-10-CM | POA: Diagnosis not present

## 2018-11-28 DIAGNOSIS — I480 Paroxysmal atrial fibrillation: Secondary | ICD-10-CM | POA: Diagnosis not present

## 2018-11-28 DIAGNOSIS — R0902 Hypoxemia: Secondary | ICD-10-CM | POA: Diagnosis not present

## 2018-11-28 DIAGNOSIS — R300 Dysuria: Secondary | ICD-10-CM | POA: Diagnosis not present

## 2018-11-28 DIAGNOSIS — R58 Hemorrhage, not elsewhere classified: Secondary | ICD-10-CM | POA: Diagnosis not present

## 2018-11-28 DIAGNOSIS — I4891 Unspecified atrial fibrillation: Secondary | ICD-10-CM | POA: Diagnosis not present

## 2018-11-28 DIAGNOSIS — E785 Hyperlipidemia, unspecified: Secondary | ICD-10-CM | POA: Diagnosis not present

## 2018-11-28 DIAGNOSIS — X58XXXS Exposure to other specified factors, sequela: Secondary | ICD-10-CM | POA: Diagnosis not present

## 2018-11-28 DIAGNOSIS — J80 Acute respiratory distress syndrome: Secondary | ICD-10-CM | POA: Diagnosis not present

## 2018-11-28 DIAGNOSIS — I5022 Chronic systolic (congestive) heart failure: Secondary | ICD-10-CM | POA: Diagnosis not present

## 2018-11-28 DIAGNOSIS — G459 Transient cerebral ischemic attack, unspecified: Secondary | ICD-10-CM | POA: Diagnosis not present

## 2018-11-28 DIAGNOSIS — N390 Urinary tract infection, site not specified: Secondary | ICD-10-CM | POA: Diagnosis not present

## 2018-11-28 DIAGNOSIS — Z7401 Bed confinement status: Secondary | ICD-10-CM | POA: Diagnosis not present

## 2018-11-28 DIAGNOSIS — S3023XA Contusion of vagina and vulva, initial encounter: Secondary | ICD-10-CM | POA: Diagnosis not present

## 2018-11-28 DIAGNOSIS — L98499 Non-pressure chronic ulcer of skin of other sites with unspecified severity: Secondary | ICD-10-CM | POA: Diagnosis not present

## 2018-11-28 DIAGNOSIS — W19XXXA Unspecified fall, initial encounter: Secondary | ICD-10-CM | POA: Diagnosis not present

## 2018-11-28 DIAGNOSIS — R7889 Finding of other specified substances, not normally found in blood: Secondary | ICD-10-CM | POA: Diagnosis not present

## 2018-11-28 DIAGNOSIS — R531 Weakness: Secondary | ICD-10-CM | POA: Diagnosis not present

## 2018-11-28 DIAGNOSIS — Z86711 Personal history of pulmonary embolism: Secondary | ICD-10-CM | POA: Diagnosis not present

## 2018-11-28 DIAGNOSIS — J449 Chronic obstructive pulmonary disease, unspecified: Secondary | ICD-10-CM | POA: Diagnosis not present

## 2018-11-28 DIAGNOSIS — I679 Cerebrovascular disease, unspecified: Secondary | ICD-10-CM | POA: Diagnosis not present

## 2018-11-28 DIAGNOSIS — N3080 Other cystitis without hematuria: Secondary | ICD-10-CM | POA: Diagnosis not present

## 2018-11-28 DIAGNOSIS — N939 Abnormal uterine and vaginal bleeding, unspecified: Secondary | ICD-10-CM | POA: Diagnosis not present

## 2018-11-28 DIAGNOSIS — J9610 Chronic respiratory failure, unspecified whether with hypoxia or hypercapnia: Secondary | ICD-10-CM | POA: Diagnosis not present

## 2018-11-28 DIAGNOSIS — R52 Pain, unspecified: Secondary | ICD-10-CM | POA: Diagnosis not present

## 2018-11-28 DIAGNOSIS — Z87440 Personal history of urinary (tract) infections: Secondary | ICD-10-CM | POA: Diagnosis not present

## 2018-12-01 DIAGNOSIS — J189 Pneumonia, unspecified organism: Secondary | ICD-10-CM | POA: Diagnosis not present

## 2018-12-01 DIAGNOSIS — I480 Paroxysmal atrial fibrillation: Secondary | ICD-10-CM | POA: Diagnosis not present

## 2018-12-01 DIAGNOSIS — S3023XD Contusion of vagina and vulva, subsequent encounter: Secondary | ICD-10-CM | POA: Diagnosis not present

## 2018-12-01 DIAGNOSIS — I5022 Chronic systolic (congestive) heart failure: Secondary | ICD-10-CM | POA: Diagnosis not present

## 2018-12-02 DIAGNOSIS — I5022 Chronic systolic (congestive) heart failure: Secondary | ICD-10-CM | POA: Diagnosis not present

## 2018-12-02 DIAGNOSIS — N939 Abnormal uterine and vaginal bleeding, unspecified: Secondary | ICD-10-CM | POA: Diagnosis not present

## 2018-12-02 DIAGNOSIS — J449 Chronic obstructive pulmonary disease, unspecified: Secondary | ICD-10-CM | POA: Diagnosis not present

## 2018-12-02 DIAGNOSIS — X58XXXS Exposure to other specified factors, sequela: Secondary | ICD-10-CM | POA: Diagnosis not present

## 2018-12-02 DIAGNOSIS — S3095XS Unspecified superficial injury of vagina and vulva, sequela: Secondary | ICD-10-CM | POA: Diagnosis not present

## 2018-12-02 DIAGNOSIS — I480 Paroxysmal atrial fibrillation: Secondary | ICD-10-CM | POA: Diagnosis not present

## 2018-12-02 DIAGNOSIS — S3023XD Contusion of vagina and vulva, subsequent encounter: Secondary | ICD-10-CM | POA: Diagnosis not present

## 2018-12-03 DIAGNOSIS — J449 Chronic obstructive pulmonary disease, unspecified: Secondary | ICD-10-CM | POA: Diagnosis not present

## 2018-12-03 DIAGNOSIS — I480 Paroxysmal atrial fibrillation: Secondary | ICD-10-CM | POA: Diagnosis not present

## 2018-12-03 DIAGNOSIS — S3023XD Contusion of vagina and vulva, subsequent encounter: Secondary | ICD-10-CM | POA: Diagnosis not present

## 2018-12-03 DIAGNOSIS — L98499 Non-pressure chronic ulcer of skin of other sites with unspecified severity: Secondary | ICD-10-CM | POA: Diagnosis not present

## 2018-12-03 DIAGNOSIS — I5022 Chronic systolic (congestive) heart failure: Secondary | ICD-10-CM | POA: Diagnosis not present

## 2018-12-06 DIAGNOSIS — I679 Cerebrovascular disease, unspecified: Secondary | ICD-10-CM | POA: Diagnosis not present

## 2018-12-06 DIAGNOSIS — I10 Essential (primary) hypertension: Secondary | ICD-10-CM | POA: Diagnosis not present

## 2018-12-06 DIAGNOSIS — I480 Paroxysmal atrial fibrillation: Secondary | ICD-10-CM | POA: Diagnosis not present

## 2018-12-06 DIAGNOSIS — I5022 Chronic systolic (congestive) heart failure: Secondary | ICD-10-CM | POA: Diagnosis not present

## 2018-12-12 DIAGNOSIS — I5022 Chronic systolic (congestive) heart failure: Secondary | ICD-10-CM | POA: Diagnosis not present

## 2018-12-12 DIAGNOSIS — W19XXXD Unspecified fall, subsequent encounter: Secondary | ICD-10-CM | POA: Diagnosis not present

## 2018-12-12 DIAGNOSIS — S3023XD Contusion of vagina and vulva, subsequent encounter: Secondary | ICD-10-CM | POA: Diagnosis not present

## 2018-12-12 DIAGNOSIS — G459 Transient cerebral ischemic attack, unspecified: Secondary | ICD-10-CM | POA: Diagnosis not present

## 2018-12-12 DIAGNOSIS — Z87440 Personal history of urinary (tract) infections: Secondary | ICD-10-CM | POA: Diagnosis not present

## 2018-12-12 DIAGNOSIS — I10 Essential (primary) hypertension: Secondary | ICD-10-CM | POA: Diagnosis not present

## 2018-12-12 DIAGNOSIS — J9610 Chronic respiratory failure, unspecified whether with hypoxia or hypercapnia: Secondary | ICD-10-CM | POA: Diagnosis not present

## 2018-12-12 DIAGNOSIS — J449 Chronic obstructive pulmonary disease, unspecified: Secondary | ICD-10-CM | POA: Diagnosis not present

## 2018-12-12 DIAGNOSIS — R300 Dysuria: Secondary | ICD-10-CM | POA: Diagnosis not present

## 2018-12-12 DIAGNOSIS — N390 Urinary tract infection, site not specified: Secondary | ICD-10-CM | POA: Diagnosis not present

## 2018-12-12 DIAGNOSIS — J189 Pneumonia, unspecified organism: Secondary | ICD-10-CM | POA: Diagnosis not present

## 2018-12-12 DIAGNOSIS — Z86711 Personal history of pulmonary embolism: Secondary | ICD-10-CM | POA: Diagnosis not present

## 2018-12-13 DIAGNOSIS — Z87440 Personal history of urinary (tract) infections: Secondary | ICD-10-CM | POA: Diagnosis not present

## 2018-12-13 DIAGNOSIS — R300 Dysuria: Secondary | ICD-10-CM | POA: Diagnosis not present

## 2018-12-15 DIAGNOSIS — R0689 Other abnormalities of breathing: Secondary | ICD-10-CM | POA: Diagnosis not present

## 2018-12-15 DIAGNOSIS — R0602 Shortness of breath: Secondary | ICD-10-CM | POA: Diagnosis not present

## 2018-12-15 DIAGNOSIS — R0902 Hypoxemia: Secondary | ICD-10-CM | POA: Diagnosis not present

## 2018-12-15 DIAGNOSIS — J9601 Acute respiratory failure with hypoxia: Secondary | ICD-10-CM | POA: Diagnosis not present

## 2018-12-15 DIAGNOSIS — J80 Acute respiratory distress syndrome: Secondary | ICD-10-CM | POA: Diagnosis not present

## 2018-12-15 DIAGNOSIS — A419 Sepsis, unspecified organism: Secondary | ICD-10-CM | POA: Diagnosis not present

## 2018-12-15 DIAGNOSIS — N390 Urinary tract infection, site not specified: Secondary | ICD-10-CM | POA: Diagnosis not present

## 2018-12-15 DIAGNOSIS — R6521 Severe sepsis with septic shock: Secondary | ICD-10-CM | POA: Diagnosis not present

## 2018-12-15 DIAGNOSIS — I4892 Unspecified atrial flutter: Secondary | ICD-10-CM | POA: Diagnosis not present

## 2018-12-15 DIAGNOSIS — J9600 Acute respiratory failure, unspecified whether with hypoxia or hypercapnia: Secondary | ICD-10-CM | POA: Diagnosis not present

## 2018-12-15 DIAGNOSIS — I21A1 Myocardial infarction type 2: Secondary | ICD-10-CM | POA: Diagnosis not present

## 2018-12-15 DIAGNOSIS — J180 Bronchopneumonia, unspecified organism: Secondary | ICD-10-CM | POA: Diagnosis not present

## 2018-12-15 DIAGNOSIS — J449 Chronic obstructive pulmonary disease, unspecified: Secondary | ICD-10-CM | POA: Diagnosis not present

## 2018-12-15 DIAGNOSIS — E6609 Other obesity due to excess calories: Secondary | ICD-10-CM | POA: Diagnosis not present

## 2018-12-15 DIAGNOSIS — E872 Acidosis: Secondary | ICD-10-CM | POA: Diagnosis not present

## 2018-12-15 DIAGNOSIS — J440 Chronic obstructive pulmonary disease with acute lower respiratory infection: Secondary | ICD-10-CM | POA: Diagnosis not present

## 2018-12-15 DIAGNOSIS — J189 Pneumonia, unspecified organism: Secondary | ICD-10-CM | POA: Diagnosis not present

## 2018-12-15 DIAGNOSIS — I5022 Chronic systolic (congestive) heart failure: Secondary | ICD-10-CM | POA: Diagnosis not present

## 2018-12-15 DIAGNOSIS — I130 Hypertensive heart and chronic kidney disease with heart failure and stage 1 through stage 4 chronic kidney disease, or unspecified chronic kidney disease: Secondary | ICD-10-CM | POA: Diagnosis not present

## 2018-12-15 DIAGNOSIS — J810 Acute pulmonary edema: Secondary | ICD-10-CM | POA: Diagnosis not present

## 2018-12-15 DIAGNOSIS — I472 Ventricular tachycardia: Secondary | ICD-10-CM | POA: Diagnosis not present

## 2018-12-15 DIAGNOSIS — I509 Heart failure, unspecified: Secondary | ICD-10-CM | POA: Diagnosis not present

## 2018-12-16 DIAGNOSIS — I471 Supraventricular tachycardia: Secondary | ICD-10-CM | POA: Diagnosis not present

## 2018-12-16 DIAGNOSIS — I4510 Unspecified right bundle-branch block: Secondary | ICD-10-CM | POA: Diagnosis not present

## 2018-12-16 DIAGNOSIS — I4519 Other right bundle-branch block: Secondary | ICD-10-CM | POA: Diagnosis not present

## 2018-12-16 DIAGNOSIS — A419 Sepsis, unspecified organism: Secondary | ICD-10-CM | POA: Diagnosis not present

## 2018-12-16 DIAGNOSIS — R6521 Severe sepsis with septic shock: Secondary | ICD-10-CM | POA: Diagnosis not present

## 2018-12-16 DIAGNOSIS — I444 Left anterior fascicular block: Secondary | ICD-10-CM | POA: Diagnosis not present

## 2018-12-16 DIAGNOSIS — I452 Bifascicular block: Secondary | ICD-10-CM | POA: Diagnosis not present

## 2018-12-16 DIAGNOSIS — I445 Left posterior fascicular block: Secondary | ICD-10-CM | POA: Diagnosis not present

## 2018-12-16 DIAGNOSIS — J9601 Acute respiratory failure with hypoxia: Secondary | ICD-10-CM | POA: Diagnosis not present

## 2018-12-16 DIAGNOSIS — I4891 Unspecified atrial fibrillation: Secondary | ICD-10-CM | POA: Diagnosis not present

## 2019-01-10 DEATH — deceased
